# Patient Record
Sex: Female | Born: 1980
Health system: Southern US, Community
[De-identification: ages and names within clinical notes are randomized; demographics above are authoritative.]

## PROBLEM LIST (undated history)

## (undated) DIAGNOSIS — E2839 Other primary ovarian failure: Secondary | ICD-10-CM

## (undated) DIAGNOSIS — R87629 Unspecified abnormal cytological findings in specimens from vagina: Secondary | ICD-10-CM

## (undated) DIAGNOSIS — R000 Tachycardia, unspecified: Secondary | ICD-10-CM

## (undated) DIAGNOSIS — E288 Other ovarian dysfunction: Secondary | ICD-10-CM

## (undated) HISTORY — DX: Tachycardia, unspecified: R00.0

## (undated) HISTORY — DX: Other primary ovarian failure: E28.39

## (undated) HISTORY — DX: Other ovarian dysfunction: E28.8

## (undated) HISTORY — DX: Unspecified abnormal cytological findings in specimens from vagina: R87.629

## (undated) HISTORY — PX: GYNECOLOGIC CRYOSURGERY: SHX857

---

## 2013-05-23 ENCOUNTER — Ambulatory Visit (INDEPENDENT_AMBULATORY_CARE_PROVIDER_SITE_OTHER): Payer: BC Managed Care – PPO | Admitting: Cardiology

## 2013-05-23 ENCOUNTER — Encounter: Payer: Self-pay | Admitting: Cardiology

## 2013-05-23 ENCOUNTER — Encounter (INDEPENDENT_AMBULATORY_CARE_PROVIDER_SITE_OTHER): Payer: Self-pay

## 2013-05-23 VITALS — BP 100/68 | HR 53 | Ht 66.0 in | Wt 121.0 lb

## 2013-05-23 DIAGNOSIS — R002 Palpitations: Secondary | ICD-10-CM

## 2013-05-23 DIAGNOSIS — R000 Tachycardia, unspecified: Secondary | ICD-10-CM

## 2013-05-23 MED ORDER — METOPROLOL SUCCINATE ER 25 MG PO TB24: 25.0000 mg | ORAL_TABLET | Freq: Every day | ORAL | Status: DC

## 2013-05-23 NOTE — Progress Notes (Signed)
[address] [place], [place]  [address] Phone: [phone] Fax:  [phone]  Date:  05/23/2013   ID:  Ellen Summers, DOB [birth year], MRN [id number]  PCP:  No primary provider on file.   History of Present Illness: Ellen Summers is a 32 y.o. female hx of chest pain with ETT without ischemia but noted resting heart rate 104 during test that increased to 206 during exercise. BP 114/78. She was given Toprol 25 mg once a day and has done very well with this medication. She was previously having several palpitations and her heart rate would increase to 170 with just walking and she wanted to start the beta blocker. She feels great on the medication. Her heart rate is better controlled now up to 170 to 180 at peak exercise as compared to > 200 and resting heart rate down 20 beats. She feels so much better and denies any side effects on the Toprol.    Patient denies chest pain, dizziness, syncope, swelling, SOB, nor PND.    Wt Readings from Last 3 Encounters:  05/23/13 121 lb (54.885 kg)     Past Medical History  Diagnosis Date  . Tachycardia     No past surgical history on file.  Current Outpatient Prescriptions  Medication Sig Dispense Refill  . levonorgestrel (MIRENA) 20 MCG/24HR IUD 1 each by Intrauterine route once.      . metoprolol succinate (TOPROL-XL) 25 MG 24 hr tablet Take 25 mg by mouth daily.      . Multiple Vitamin (MULTIVITAMIN) tablet Take 1 tablet by mouth daily.      . Omega-3 Fatty Acids (FISH OIL) 1000 MG CAPS Take 2,000 mg by mouth 2 (two) times daily.       No current facility-administered medications for this visit.    Allergies:   No Known Allergies  Social History:  The patient  reports that she has never smoked. She does not have any smokeless tobacco history on file. She reports that she drinks alcohol. She reports that she does not use illicit drugs.   Family history: Emelia Loron, paternal had MI at age 23.  ROS:   Please see the history of present illness.   No syncope, no bleeding, no orthopnea, no PND   PHYSICAL EXAM: VS:  BP 100/68  Pulse 53  Ht 5\' 6"  (1.676 m)  Wt 121 lb (54.885 kg)  BMI 19.54 kg/m2 Well nourished, well developed, in no acute distress HEENT: normal Neck: no JVD Cardiac:  normal S1, S2; RRR; no murmur Lungs:  clear to auscultation bilaterally, no wheezing, rhonchi or rales Abd: soft, nontender, no hepatomegaly Ext: no edema Skin: warm and dry Neuro: no focal abnormalities noted  EKG:  Sinus bradycardia rate 53, borderline LVH voltage criteria likely normal variant.  ECHO: 07/07/11: 1. Normal LV size and function.  2. Left ventricular ejection fraction estimated by 2D at 60-65 percent.  3. There were no regional wall motion abnormalities.  4. Trace mitral valve regurgitation.  5. Trivial tricuspid regurgitation.  6. Trace of pulmonic valve regurgitation.  ETT: 07/07/11:  Preexercise EKG demonstrated sinus tachycardia rate 104, short PR interval, no delta wave, nonspecific ST changes. LVH pattern noted, likely due to body habitus. During exercise, heart rate increased to 153 beats per minute at the end of stage I or 3 minutes, then at its peak was 206 beats per minute, sinus, narrow complex with no ST segment changes. Heart rate at 2 minutes was  153 once again. No sustained arrhythmias were noted. Blood pressure increased from 102/78 at rest, 160/76 at stress. She completed 11 minutes of exercise. She did state that at peak exercise she had mild chest tightness. This resolved at rest. No ST segment changes. Impression: Low risk exercise treadmill test. Good overall exercise tolerance. Elevated heart rate during maximal stress but appears to have normal conduction/sinus tachycardia.   ASSESSMENT AND PLAN:  1. Palpitations - tachycardia. doing very well on low-dose Toprol. EKG reassuring. No changes made. Continue with exercise.  Signed, Donato Schultz, MD [facility]  05/23/2013  3:52 PM

## 2013-05-23 NOTE — Patient Instructions (Signed)
Your physician recommends that you continue on your current medications as directed. Please refer to the Current Medication list given to you today.  Your physician wants you to follow-up in: 1 year with Dr. Skains. You will receive a reminder letter in the mail two months in advance. If you don't receive a letter, please call our office to schedule the follow-up appointment.  

## 2014-05-29 ENCOUNTER — Other Ambulatory Visit: Payer: Self-pay | Admitting: Cardiology

## 2014-06-18 ENCOUNTER — Encounter: Payer: Self-pay | Admitting: Physician Assistant

## 2014-06-18 ENCOUNTER — Ambulatory Visit (INDEPENDENT_AMBULATORY_CARE_PROVIDER_SITE_OTHER): Payer: PRIVATE HEALTH INSURANCE | Admitting: Physician Assistant

## 2014-06-18 VITALS — BP 102/70 | HR 59 | Ht 66.0 in | Wt 119.0 lb

## 2014-06-18 DIAGNOSIS — R000 Tachycardia, unspecified: Secondary | ICD-10-CM

## 2014-06-18 DIAGNOSIS — R002 Palpitations: Secondary | ICD-10-CM

## 2014-06-18 DIAGNOSIS — I517 Cardiomegaly: Secondary | ICD-10-CM

## 2014-06-18 MED ORDER — METOPROLOL SUCCINATE ER 25 MG PO TB24: 25.0000 mg | ORAL_TABLET | Freq: Every day | ORAL | Status: DC

## 2014-06-18 NOTE — Progress Notes (Signed)
WUJ:WJXBJYNW Ellen Summers is a 34 y.o. female patient of Dr. Anne Fu with hx of chest pain with ETT without ischemia but noted resting heart rate 104 during test that increased to 206 during exercise. BP 114/78. She was given Toprol 25 mg once a day and has done very well with this medication. She was previously having several palpitations and her heart rate would increase to 170 with just walking and she wanted to start the beta blocker. She feels great on the medication. Her heart rate is better controlled now up to 170 to 180 at peak exercise as compared to > 200 and resting heart rate down 20 beats. EKGs in the past have shown LVH but 2-D echo 2 years ago showed normal LV function with no evidence of LVH.  Patient is here today for her yearly follow-up and renewal of her Toprol. She continues to run 3 miles daily. She can get her heart rate up to 180 bpm and feel fine. It no longer gets over 200. She says she may feel little fatigued from the metoprolol but is bearable. She has occasional palpitations when she is stressed out at work, but overall it is controlled.  No Known Allergies   Current Outpatient Prescriptions  Medication Sig Dispense Refill  . levonorgestrel (MIRENA) 20 MCG/24HR IUD 1 each by Intrauterine route once.    . metoprolol succinate (TOPROL-XL) 25 MG 24 hr tablet TAKE ONE TABLET BY MOUTH ONE TIME DAILY  30 tablet 0  . Multiple Vitamin (MULTIVITAMIN) tablet Take 1 tablet by mouth daily.    . Omega-3 Fatty Acids (FISH OIL) 1000 MG CAPS Take 2,000 mg by mouth 2 (two) times daily.     No current facility-administered medications for this visit.    Past Medical History  Diagnosis Date  . Tachycardia     No past surgical history on file.  Family History  Problem Relation Age of Onset  . Heart attack Paternal Grandfather   . Coronary artery disease Neg Hx     History   Social History  . Marital Status: Married    Spouse Name: N/A    Number of  Children: N/A  . Years of Education: N/A   Occupational History  . Not on file.   Social History Main Topics  . Smoking status: Never Smoker   . Smokeless tobacco: Not on file  . Alcohol Use: Yes     Comment: 1-2 drinks per week  . Drug Use: No  . Sexual Activity: Not on file   Other Topics Concern  . Not on file   Social History Narrative    ROS:see history of present illness otherwise negative   BP 102/70 mmHg  Pulse 59  Ht  (1.676 m)  Wt 119 lb (53.978 kg)  BMI 19.22 kg/m2  PHYSICAL EXAM: Well-nournished, in no acute distress. Neck: No JVD, HJR, Bruit, or thyroid enlargement  Lungs: No tachypnea, clear without wheezing, rales, or rhonchi  Cardiovascular: RRR, PMI not displaced, Normal S1 and S2, no murmurs, gallops, bruit, thrill, or heave.  Abdomen: BS normal. Soft without organomegaly, masses, lesions or tenderness.  Extremities: without cyanosis, clubbing or edema. Good distal pulses bilateral  SKin: Warm, no lesions or rashes   Musculoskeletal: No deformities  Neuro: no focal signs   Wt Readings from Last 3 Encounters:  06/18/14 119 lb (53.978 kg)  05/23/13 121 lb (54.885 kg)    No results found for: WBC, HGB, HCT, PLT, GLUCOSE, CHOL,  TRIG, HDL, LDLDIRECT, LDLCALC, ALT, AST, NA, K, CL, CREATININE, BUN, CO2, TSH, PSA, INR, GLUF, HGBA1C, MICROALBUR  LKG:MWNUUEKG:Sinus bradycardia at 59 bpm with LVH. Similar to EKG 2 years ago.

## 2014-06-18 NOTE — Assessment & Plan Note (Signed)
Tachycardia seems to be controlled with Toprol. We'll renew prescription. She does have LVH on EKG which is unchanged. Recommend 2-D echo to follow-up and make sure she does not have any underlying LVH. It's been 2 years since her last echo.

## 2014-06-18 NOTE — Assessment & Plan Note (Signed)
Palpitations controlled.

## 2014-06-18 NOTE — Patient Instructions (Signed)
Your physician recommends that you continue on your current medications as directed. Please refer to the Current Medication list given to you today. Your physician has requested that you have an echocardiogram. Echocardiography is a painless test that uses sound waves to create images of your heart. It provides your doctor with information about the size and shape of your heart and how well your heart's chambers and valves are working. This procedure takes approximately one hour. There are no restrictions for this procedure.  Your physician wants you to follow-up in: 1 YEAR WITH DR Anne FuSKAINS.   You will receive a reminder letter in the mail two months in advance. If you don't receive a letter, please call our office to schedule the follow-up appointment.

## 2014-06-25 ENCOUNTER — Ambulatory Visit (HOSPITAL_COMMUNITY): Payer: PRIVATE HEALTH INSURANCE | Attending: Cardiology

## 2014-06-25 DIAGNOSIS — I517 Cardiomegaly: Secondary | ICD-10-CM | POA: Diagnosis not present

## 2014-06-25 DIAGNOSIS — R000 Tachycardia, unspecified: Secondary | ICD-10-CM | POA: Diagnosis not present

## 2014-06-25 DIAGNOSIS — I081 Rheumatic disorders of both mitral and tricuspid valves: Secondary | ICD-10-CM | POA: Insufficient documentation

## 2014-06-25 NOTE — Progress Notes (Signed)
2D Echo completed. 06/25/2014 

## 2014-07-01 ENCOUNTER — Telehealth: Payer: Self-pay | Admitting: Cardiology

## 2014-07-01 NOTE — Telephone Encounter (Signed)
Reviewed results of echo with pt.  Unable to locate the in basket this results is in in order to document on it.

## 2014-07-01 NOTE — Telephone Encounter (Signed)
New Msg ° ° ° ° ° ° ° °Pt calling to get results of echo. ° ° ° °Please return call.  °

## 2015-06-15 NOTE — L&D Delivery Note (Signed)
Delivery Note At 11:32 PM a viable female was delivered via Vaginal, Spontaneous Delivery (Presentation: Right Occiput Anterior  ;  ).  APGAR: 9, 9; weight 8 lb 5 oz (3770 g).  Delivery was complicated by retained placenta. The placenta was manually extracted. Pt tolerated this well however developed postpartum hemorrhage after manual extraction of the placenta. Fundal massage was performed. She received Methergine 0.2 mg IM/ 1000 mcg of cytotec per rectum. .. 1 Amp of hemabate IM. A bedside ultrasound was performed and preliminary report did not show any retained products. Blood was noted within the endometrium The placenta was examined and appeared complete a 3 Vessel cord was noted.  ,  Cord pH: NA  Anesthesia:   Episiotomy: None Lacerations: 3rd degree Suture Repair: 3.0 monocryl and 3-0 vicryl  Est. Blood Loss (mL): 1500  Mom to postpartum.  Baby to Couplet care / Skin to Skin.  Marcellous Snarski J. 04/10/2016, 1:06 AM

## 2015-07-02 LAB — OB RESULTS CONSOLE ABO/RH: RH TYPE: NEGATIVE

## 2015-07-02 LAB — OB RESULTS CONSOLE HEPATITIS B SURFACE ANTIGEN: HEP B S AG: NEGATIVE

## 2015-07-02 LAB — OB RESULTS CONSOLE GC/CHLAMYDIA
CHLAMYDIA, DNA PROBE: NEGATIVE
GC PROBE AMP, GENITAL: NEGATIVE

## 2015-07-02 LAB — OB RESULTS CONSOLE HIV ANTIBODY (ROUTINE TESTING): HIV: NONREACTIVE

## 2015-07-02 LAB — OB RESULTS CONSOLE RPR: RPR: NONREACTIVE

## 2015-08-04 ENCOUNTER — Telehealth: Payer: Self-pay | Admitting: Cardiology

## 2015-08-04 NOTE — Telephone Encounter (Signed)
Will review with Dr Anne Fu and call her back with recommendations

## 2015-08-04 NOTE — Telephone Encounter (Signed)
First of all congratulations. Secondly, let us try without metoprolol. I would recommend stopping it during the pregnancy. If palpitiations begin to worsen which hopefully they will not, labetalol is the preferred beta blocker in pregnancy. We could always try this in the future if absolutely necessary.  Donato Schultz, MD

## 2015-08-04 NOTE — Telephone Encounter (Signed)
New Message:  Pt called in stating that she is [redacted] wks pregnant and DRr. Skains prescribed Metoprolol  for her. She wanted to know there any precautions she needed to take or if her medication needed to be switched. Please f/u with her

## 2015-08-04 NOTE — Telephone Encounter (Signed)
Discussed use of metoprolol 25 mg. Called her personally to discuss this matter with her. I consult the 2 separate pharmacist here and we reviewed data. There have been no reportable fetal abnormalities with metoprolol. It does cross the placenta. At this low dose, concentrations would be exceedingly low. She has stopped the metoprolol. She felt some mild dizziness when standing which could be related to her pregnancy. I encouraged continued hydration. Reassurance. She was thankful for the phone call. Donato Schultz, MD

## 2015-08-04 NOTE — Telephone Encounter (Signed)
Reviewed recommendations with patient who states understanding.  She will stop the metoprolol and call back if she feels she needs the RX for Labetalol.  She is asking about possible complications from taking Metoprolol during the 1st 6 weeks of her pregnancy.  Advised I am unaware of specific complications but will discuss with Dr Anne Fu or one of our pharmacists.   Reviewed with Dr Anne Fu who discussed with pharmacist.  No studies have been done on humans but there have been some incidence of decreased fetal heart rate because it does cross the placenta.  Dr Anne Fu will call and discuss pt's concerns with her.

## 2015-08-11 DIAGNOSIS — O200 Threatened abortion: Secondary | ICD-10-CM | POA: Insufficient documentation

## 2015-08-30 ENCOUNTER — Other Ambulatory Visit: Payer: Self-pay | Admitting: Physician Assistant

## 2015-12-23 LAB — OB RESULTS CONSOLE RPR: RPR: NONREACTIVE

## 2016-01-13 LAB — OB RESULTS CONSOLE ABO/RH: RH TYPE: NEGATIVE

## 2016-02-27 LAB — OB RESULTS CONSOLE GBS: STREP GROUP B AG: POSITIVE

## 2016-03-31 ENCOUNTER — Telehealth (HOSPITAL_COMMUNITY): Payer: Self-pay | Admitting: *Deleted

## 2016-03-31 ENCOUNTER — Encounter (HOSPITAL_COMMUNITY): Payer: Self-pay | Admitting: *Deleted

## 2016-03-31 NOTE — Telephone Encounter (Signed)
Preadmission screen  

## 2016-04-08 ENCOUNTER — Telehealth (HOSPITAL_COMMUNITY): Payer: Self-pay | Admitting: *Deleted

## 2016-04-08 ENCOUNTER — Other Ambulatory Visit: Payer: Self-pay | Admitting: Obstetrics and Gynecology

## 2016-04-08 NOTE — Telephone Encounter (Signed)
Preadmission screen  

## 2016-04-08 NOTE — H&P (Signed)
Corliss SkainsJennifer Raudenbush is a 35 y.o. female G1P0 at 41 wks and 0 days presenting for induction of labor due to post dates. Pregnancy has been uncomplicated. She receives prenatal care at [facility] with Dr. [name] as her primary provider. Her pregnancy was conceived via IVF with donor egg. She has a  H/o premature ovarian failure.   . OB History    Gravida Para Term Preterm AB Living   1             SAB TAB Ectopic Multiple Live Births                 Past Medical History:  Diagnosis Date  . Premature ovarian failure   . Tachycardia   . Vaginal Pap smear, abnormal    Past Surgical History:  Procedure Laterality Date  . GYNECOLOGIC CRYOSURGERY     Family History: family history includes Diabetes in her maternal grandfather, maternal grandmother, and mother; Heart attack in her paternal grandfather; Heart disease in her maternal grandfather, maternal grandmother, and paternal grandfather; Hypertension in her mother; Thyroid disease in her maternal grandmother; Varicose Veins in her mother. Social History:  reports that she has never smoked. She has never used smokeless tobacco. She reports that she drinks alcohol. She reports that she does not use drugs.     Maternal Diabetes: No Genetic Screening: Normal Maternal Ultrasounds/Referrals: Normal Fetal Ultrasounds or other Referrals:  None Maternal Substance Abuse:  No Significant Maternal Medications:  None Significant Maternal Lab Results:  Lab values include: Group B Strep positive. RH negative  Other Comments:  None  Review of Systems  Constitutional: Negative.   HENT: Negative.   Eyes: Negative.   Respiratory: Negative.   Cardiovascular: Negative.   Gastrointestinal: Negative.   Genitourinary: Negative.   Musculoskeletal: Negative.   Skin: Negative.   Neurological: Negative.   Endo/Heme/Allergies: Negative.   Psychiatric/Behavioral: Negative.    Maternal Medical History:  Fetal activity: Perceived fetal activity is normal.     Prenatal complications: no prenatal complications Prenatal Complications - Diabetes: none.      Last menstrual period 06/15/2015. Maternal Exam:  Abdomen: Patient reports no abdominal tenderness. Fetal presentation: vertex  Introitus: Normal vulva. Normal vagina.  Pelvis: adequate for delivery.      Physical Exam  Vitals reviewed. Constitutional: She is oriented to person, place, and time. She appears well-developed and well-nourished.  HENT:  Head: Normocephalic and atraumatic.  Eyes: Conjunctivae are normal. Pupils are equal, round, and reactive to light.  Cardiovascular: Normal rate and regular rhythm.   Respiratory: Effort normal and breath sounds normal.  GI: There is no tenderness.  Genitourinary: Vagina normal.  Musculoskeletal: Normal range of motion. She exhibits no edema.  Neurological: She is alert and oriented to person, place, and time.  Skin: Skin is warm and dry.  Psychiatric: She has a normal mood and affect.    Prenatal labs: ABO, Rh: O/Negative/-- (08/01 0000) Antibody:  Negative Rubella:   RPR: Nonreactive (07/11 0000)  HBsAg: Negative (01/18 0000)  HIV: Non-reactive (01/18 0000)  GBS: Positive (09/15 0000)   Assessment/Plan: 41 wks and 0 days for induction due to post dates.  Plan cytotec for cervical ripening.  Penicillin for GBS prophylaxis.  Anticipate SVD Dr. Dion BodyVarnado covering midnight until 7am 04/09/2016.  I will assume care at 7 am on 04/09/2016   Tylar Merendino J. 04/08/2016, 2:38 PM

## 2016-04-09 ENCOUNTER — Inpatient Hospital Stay (HOSPITAL_COMMUNITY): Payer: Managed Care, Other (non HMO) | Admitting: Anesthesiology

## 2016-04-09 ENCOUNTER — Inpatient Hospital Stay (HOSPITAL_COMMUNITY)
Admission: AD | Admit: 2016-04-09 | Discharge: 2016-04-11 | DRG: 767 | Disposition: A | Discharge status: Home / Self Care | Payer: Managed Care, Other (non HMO) | Source: Ambulatory Visit | Attending: Obstetrics and Gynecology | Admitting: Obstetrics and Gynecology

## 2016-04-09 ENCOUNTER — Encounter (HOSPITAL_COMMUNITY): Payer: Self-pay

## 2016-04-09 ENCOUNTER — Inpatient Hospital Stay (HOSPITAL_COMMUNITY)
Admission: RE | Admit: 2016-04-09 | Discharge: 2016-04-09 | Disposition: A | Discharge status: Home / Self Care | Payer: Managed Care, Other (non HMO) | Source: Ambulatory Visit | Attending: Obstetrics and Gynecology | Admitting: Obstetrics and Gynecology

## 2016-04-09 DIAGNOSIS — Z3A41 41 weeks gestation of pregnancy: Secondary | ICD-10-CM | POA: Diagnosis not present

## 2016-04-09 DIAGNOSIS — O9081 Anemia of the puerperium: Secondary | ICD-10-CM | POA: Diagnosis present

## 2016-04-09 DIAGNOSIS — D649 Anemia, unspecified: Secondary | ICD-10-CM | POA: Diagnosis present

## 2016-04-09 DIAGNOSIS — O480 Post-term pregnancy: Secondary | ICD-10-CM | POA: Diagnosis present

## 2016-04-09 DIAGNOSIS — R58 Hemorrhage, not elsewhere classified: Secondary | ICD-10-CM

## 2016-04-09 LAB — CBC
HEMATOCRIT: 31.7 % — AB (ref 36.0–46.0)
HEMOGLOBIN: 11.1 g/dL — AB (ref 12.0–15.0)
MCH: 32.4 pg (ref 26.0–34.0)
MCHC: 35 g/dL (ref 30.0–36.0)
MCV: 92.4 fL (ref 78.0–100.0)
Platelets: 172 10*3/uL (ref 150–400)
RBC: 3.43 MIL/uL — ABNORMAL LOW (ref 3.87–5.11)
RDW: 14.6 % (ref 11.5–15.5)
WBC: 8.5 10*3/uL (ref 4.0–10.5)

## 2016-04-09 LAB — RPR: RPR Ser Ql: NONREACTIVE

## 2016-04-09 MED ORDER — LACTATED RINGERS IV SOLN
500.0000 mL | INTRAVENOUS | Status: DC | PRN
  Administered 2016-04-10: 1000 mL via INTRAVENOUS

## 2016-04-09 MED ORDER — SOD CITRATE-CITRIC ACID 500-334 MG/5ML PO SOLN: 30.0000 mL | ORAL | Status: DC | PRN

## 2016-04-09 MED ORDER — DIPHENHYDRAMINE HCL 50 MG/ML IJ SOLN: 12.5000 mg | INTRAMUSCULAR | Status: DC | PRN

## 2016-04-09 MED ORDER — PENICILLIN G POTASSIUM 5000000 UNITS IJ SOLR
5.0000 10*6.[IU] | Freq: Once | INTRAMUSCULAR | Status: AC
  Administered 2016-04-09: 5 10*6.[IU] via INTRAVENOUS
  Filled 2016-04-09: qty 5

## 2016-04-09 MED ORDER — LACTATED RINGERS IV SOLN: 500.0000 mL | Freq: Once | INTRAVENOUS | Status: DC

## 2016-04-09 MED ORDER — OXYCODONE-ACETAMINOPHEN 5-325 MG PO TABS: 2.0000 | ORAL_TABLET | ORAL | Status: DC | PRN

## 2016-04-09 MED ORDER — PHENYLEPHRINE 40 MCG/ML (10ML) SYRINGE FOR IV PUSH (FOR BLOOD PRESSURE SUPPORT): 80.0000 ug | PREFILLED_SYRINGE | INTRAVENOUS | Status: DC | PRN

## 2016-04-09 MED ORDER — OXYCODONE-ACETAMINOPHEN 5-325 MG PO TABS: 1.0000 | ORAL_TABLET | ORAL | Status: DC | PRN

## 2016-04-09 MED ORDER — LIDOCAINE HCL (PF) 1 % IJ SOLN
INTRAMUSCULAR | Status: DC | PRN
  Administered 2016-04-09 (×2): 6 mL

## 2016-04-09 MED ORDER — TERBUTALINE SULFATE 1 MG/ML IJ SOLN: 0.2500 mg | Freq: Once | INTRAMUSCULAR | Status: DC | PRN

## 2016-04-09 MED ORDER — MISOPROSTOL 25 MCG QUARTER TABLET
25.0000 ug | ORAL_TABLET | ORAL | Status: DC | PRN
  Administered 2016-04-09 (×2): 25 ug via VAGINAL
  Filled 2016-04-09 (×2): qty 0.25

## 2016-04-09 MED ORDER — EPHEDRINE 5 MG/ML INJ: 10.0000 mg | INTRAVENOUS | Status: DC | PRN

## 2016-04-09 MED ORDER — LACTATED RINGERS IV SOLN
500.0000 mL | Freq: Once | INTRAVENOUS | Status: AC
  Administered 2016-04-09: 500 mL via INTRAVENOUS

## 2016-04-09 MED ORDER — ACETAMINOPHEN 325 MG PO TABS: 650.0000 mg | ORAL_TABLET | ORAL | Status: DC | PRN

## 2016-04-09 MED ORDER — HYDROXYZINE HCL 50 MG PO TABS: 50.0000 mg | ORAL_TABLET | Freq: Four times a day (QID) | ORAL | Status: DC | PRN

## 2016-04-09 MED ORDER — FENTANYL 2.5 MCG/ML BUPIVACAINE 1/10 % EPIDURAL INFUSION (WH - ANES)
14.0000 mL/h | INTRAMUSCULAR | Status: DC | PRN
  Administered 2016-04-09 (×2): 14 mL/h via EPIDURAL
  Filled 2016-04-09 (×2): qty 125

## 2016-04-09 MED ORDER — ONDANSETRON HCL 4 MG/2ML IJ SOLN
4.0000 mg | Freq: Four times a day (QID) | INTRAMUSCULAR | Status: DC | PRN
  Administered 2016-04-10: 4 mg via INTRAVENOUS
  Filled 2016-04-09: qty 2

## 2016-04-09 MED ORDER — PENICILLIN G POTASSIUM 5000000 UNITS IJ SOLR
2.5000 10*6.[IU] | INTRAVENOUS | Status: DC
  Administered 2016-04-09 (×2): 2.5 10*6.[IU] via INTRAVENOUS
  Filled 2016-04-09 (×9): qty 2.5

## 2016-04-09 MED ORDER — OXYTOCIN 40 UNITS IN LACTATED RINGERS INFUSION - SIMPLE MED: 2.5000 [IU]/h | INTRAVENOUS | Status: DC

## 2016-04-09 MED ORDER — OXYTOCIN 40 UNITS IN LACTATED RINGERS INFUSION - SIMPLE MED
1.0000 m[IU]/min | INTRAVENOUS | Status: DC
  Administered 2016-04-09: 2 m[IU]/min via INTRAVENOUS
  Filled 2016-04-09: qty 1000

## 2016-04-09 MED ORDER — LIDOCAINE HCL (PF) 1 % IJ SOLN
30.0000 mL | INTRAMUSCULAR | Status: DC | PRN
  Administered 2016-04-09: 30 mL via SUBCUTANEOUS
  Filled 2016-04-09: qty 30

## 2016-04-09 MED ORDER — LACTATED RINGERS IV SOLN
INTRAVENOUS | Status: DC
  Administered 2016-04-09 (×4): via INTRAVENOUS

## 2016-04-09 MED ORDER — FENTANYL CITRATE (PF) 100 MCG/2ML IJ SOLN: 50.0000 ug | INTRAMUSCULAR | Status: DC | PRN

## 2016-04-09 MED ORDER — OXYTOCIN BOLUS FROM INFUSION
500.0000 mL | Freq: Once | INTRAVENOUS | Status: AC
  Administered 2016-04-09: 500 mL via INTRAVENOUS

## 2016-04-09 MED ORDER — PHENYLEPHRINE 40 MCG/ML (10ML) SYRINGE FOR IV PUSH (FOR BLOOD PRESSURE SUPPORT)
80.0000 ug | PREFILLED_SYRINGE | INTRAVENOUS | Status: DC | PRN
  Filled 2016-04-09: qty 10

## 2016-04-09 NOTE — Progress Notes (Signed)
Ellen Summers is a 35 y.o. G1P0 at 545w0d  admitted for induction of labor due to Post dates. Due date 04/02/2016.  Subjective: She is comfortable with her epidural. Reports mild headache.   Objective: BP 109/62   Pulse 73   Temp 98.2 F (36.8 C) (Oral)   Resp 15   Ht 5\' 6"  (1.676 m)   Wt 64.9 kg (143 lb)   LMP 06/15/2015   SpO2 97%   BMI 23.08 kg/m  No intake/output data recorded. No intake/output data recorded.  FHT:  FHR: 130 bpm, variability: moderate,  accelerations:  Present,  decelerations:  Absent UC:   regular, every 2-3 minutes SVE:   Dilation: 6 Effacement (%): 90 Station: 0 Exam by:: Ellen Doppole, MD  Labs: Lab Results  Component Value Date   WBC 8.5 04/09/2016   HGB 11.1 (L) 04/09/2016   HCT 31.7 (L) 04/09/2016   MCV 92.4 04/09/2016   PLT 172 04/09/2016    Assessment / Plan: induction of labor due to post dates   Labor: AROM performed continue pitocin  Preeclampsia:  NA Fetal Wellbeing:  Category I Pain Control:  Epidural I/D:  Penicillin Anticipated MOD:  NSVD  Ellen Summers J. 04/09/2016, 5:42 PM

## 2016-04-09 NOTE — Anesthesia Procedure Notes (Signed)
Epidural Patient location during procedure: OB  Staffing Anesthesiologist: Sherrian DiversENENNY, Jaelynn Pozo  Preanesthetic Checklist Completed: patient identified, site marked, surgical consent, pre-op evaluation, timeout performed, IV checked, risks and benefits discussed and monitors and equipment checked  Epidural Patient position: sitting Prep: DuraPrep Patient monitoring: blood pressure and heart rate Approach: midline Location: L3-L4 Injection technique: LOR air  Needle:  Needle type: Tuohy  Needle gauge: 17 G Needle length: 9 cm Needle insertion depth: 4 cm Catheter type: closed end flexible Catheter size: 19 Gauge Catheter at skin depth: 12 cm Test dose: negative and Other  Assessment Events: blood not aspirated, injection not painful, no injection resistance, negative IV test and no paresthesia  Additional Notes Reason for block:procedure for pain

## 2016-04-09 NOTE — Anesthesia Preprocedure Evaluation (Signed)
Anesthesia Evaluation  Patient identified by MRN, date of birth, ID band Patient awake    Reviewed: Allergy & Precautions, NPO status , Patient's Chart, lab work & pertinent test results  Airway Mallampati: II  TM Distance: >3 FB Neck ROM: Full    Dental no notable dental hx.    Pulmonary neg pulmonary ROS,    Pulmonary exam normal breath sounds clear to auscultation       Cardiovascular negative cardio ROS Normal cardiovascular exam Rhythm:Regular Rate:Normal     Neuro/Psych negative neurological ROS  negative psych ROS   GI/Hepatic negative GI ROS, Neg liver ROS,   Endo/Other  negative endocrine ROS  Renal/GU negative Renal ROS  negative genitourinary   Musculoskeletal negative musculoskeletal ROS (+)   Abdominal   Peds negative pediatric ROS (+)  Hematology negative hematology ROS (+)   Anesthesia Other Findings   Reproductive/Obstetrics negative OB ROS                             Anesthesia Physical Anesthesia Plan  ASA: II  Anesthesia Plan: Epidural   Post-op Pain Management:    Induction: Intravenous  Airway Management Planned: Natural Airway  Additional Equipment:   Intra-op Plan:   Post-operative Plan:   Informed Consent: I have reviewed the patients History and Physical, chart, labs and discussed the procedure including the risks, benefits and alternatives for the proposed anesthesia with the patient or authorized representative who has indicated his/her understanding and acceptance.     Plan Discussed with: CRNA  Anesthesia Plan Comments: (Informed consent obtained prior to proceeding including risk of failure, 1% risk of PDPH, risk of minor discomfort and bruising.  Discussed rare but serious complications including epidural abscess, permanent nerve injury, epidural hematoma.  Discussed alternatives to epidural analgesia and patient desires to proceed.  Timeout  performed pre-procedure verifying patient name, procedure, and platelet count.  Patient tolerated procedure well.)        Anesthesia Quick Evaluation  

## 2016-04-09 NOTE — Progress Notes (Signed)
Ellen Summers is a 35 y.o. G1P0 at 6377w0d  admitted for induction of labor due to Post dates. Due date 04/02/2016.  Subjective: Contractions rated as 3-7 out of 10. No lof no vaginal bleeding. Pt desires an epidural.   Objective: BP 126/67   Pulse 68   Temp 98 F (36.7 C) (Oral)   Resp 17   Ht 5\' 6"  (1.676 m)   Wt 64.9 kg (143 lb)   LMP 06/15/2015   BMI 23.08 kg/m  No intake/output data recorded. No intake/output data recorded.  FHT:  FHR: 130 bpm, variability: moderate,  accelerations:  Present,  decelerations:  Present variable at times  UC:   regular, every 2-3 minutes SVE:   Dilation: 4 Effacement (%): 80 Station: -1 Exam by:: [name], MD  Labs: Lab Results  Component Value Date   WBC 8.5 04/09/2016   HGB 11.1 (L) 04/09/2016   HCT 31.7 (L) 04/09/2016   MCV 92.4 04/09/2016   PLT 172 04/09/2016    Assessment / Plan: Induction of labor due to postdates   Labor: start pitocin  Preeclampsia:  NA Fetal Wellbeing:  Category I Pain Control:  planning epidural  I/D:  penicillin for gbs prophylaxis  Anticipated MOD:  NSVD  Jarome Trull J. 04/09/2016, 1:28 PM

## 2016-04-09 NOTE — Anesthesia Pain Management Evaluation Note (Signed)
  CRNA Pain Management Visit Note  Patient: Ellen Summers, 35 y.o., female  "Hello I am a member of the anesthesia team at [facility]. We have an anesthesia team available at all times to provide care throughout the hospital, including epidural management and anesthesia for C-section. I don't know your plan for the delivery whether it a natural birth, water birth, IV sedation, nitrous supplementation, doula or epidural, but we want to meet your pain goals."   1.Was your pain managed to your expectations on prior hospitalizations?   No prior hospitalizations  2.What is your expectation for pain management during this hospitalization?     Epidural  3.How can we help you reach that goal? Epidural   Record the patient's initial score and the patient's pain goal.   Pain: 0  Pain Goal: 10 [facility] wants you to be able to say your pain was always managed very well.  Rica RecordsICKELTON,Ellen Summers 04/09/2016

## 2016-04-10 ENCOUNTER — Encounter (HOSPITAL_COMMUNITY): Payer: Self-pay | Admitting: *Deleted

## 2016-04-10 ENCOUNTER — Inpatient Hospital Stay (HOSPITAL_COMMUNITY): Payer: Managed Care, Other (non HMO)

## 2016-04-10 LAB — DIC (DISSEMINATED INTRAVASCULAR COAGULATION)PANEL
Fibrinogen: 375 mg/dL (ref 210–475)
INR: 1.06
Platelets: 162 10*3/uL (ref 150–400)
Smear Review: NONE SEEN
aPTT: 26 seconds (ref 24–36)

## 2016-04-10 LAB — CBC
HCT: 29.9 % — ABNORMAL LOW (ref 36.0–46.0)
HEMATOCRIT: 25.4 % — AB (ref 36.0–46.0)
HEMOGLOBIN: 10.4 g/dL — AB (ref 12.0–15.0)
Hemoglobin: 9.1 g/dL — ABNORMAL LOW (ref 12.0–15.0)
MCH: 32.5 pg (ref 26.0–34.0)
MCH: 33.5 pg (ref 26.0–34.0)
MCHC: 34.8 g/dL (ref 30.0–36.0)
MCHC: 35.8 g/dL (ref 30.0–36.0)
MCV: 93.4 fL (ref 78.0–100.0)
MCV: 93.4 fL (ref 78.0–100.0)
Platelets: 171 10*3/uL (ref 150–400)
Platelets: 138 10*3/uL — ABNORMAL LOW (ref 150–400)
RBC: 2.72 MIL/uL — AB (ref 3.87–5.11)
RBC: 3.2 MIL/uL — AB (ref 3.87–5.11)
RDW: 14.4 % (ref 11.5–15.5)
RDW: 14.5 % (ref 11.5–15.5)
WBC: 15 10*3/uL — ABNORMAL HIGH (ref 4.0–10.5)
WBC: 19.7 10*3/uL — AB (ref 4.0–10.5)

## 2016-04-10 LAB — DIC (DISSEMINATED INTRAVASCULAR COAGULATION) PANEL
D DIMER QUANT: 3.87 ug{FEU}/mL — AB (ref 0.00–0.50)
PROTHROMBIN TIME: 13.8 s (ref 11.4–15.2)

## 2016-04-10 LAB — PREPARE RBC (CROSSMATCH)

## 2016-04-10 LAB — POSTPARTUM HEMORRHAGE PROTOCOL (BB NOTIFICATION)

## 2016-04-10 MED ORDER — DIBUCAINE 1 % RE OINT: 1.0000 "application " | TOPICAL_OINTMENT | RECTAL | Status: DC | PRN

## 2016-04-10 MED ORDER — SENNOSIDES-DOCUSATE SODIUM 8.6-50 MG PO TABS: 2.0000 | ORAL_TABLET | ORAL | Status: DC

## 2016-04-10 MED ORDER — MISOPROSTOL 200 MCG PO TABS
ORAL_TABLET | ORAL | Status: AC
  Filled 2016-04-10: qty 5

## 2016-04-10 MED ORDER — ACETAMINOPHEN 325 MG PO TABS
650.0000 mg | ORAL_TABLET | ORAL | Status: DC | PRN
  Administered 2016-04-10 – 2016-04-11 (×3): 650 mg via ORAL
  Filled 2016-04-10 (×3): qty 2

## 2016-04-10 MED ORDER — COCONUT OIL OIL
1.0000 "application " | TOPICAL_OIL | Status: DC | PRN
  Administered 2016-04-11: 1 via TOPICAL
  Filled 2016-04-10: qty 120

## 2016-04-10 MED ORDER — ONDANSETRON HCL 4 MG PO TABS: 4.0000 mg | ORAL_TABLET | ORAL | Status: DC | PRN

## 2016-04-10 MED ORDER — METHYLERGONOVINE MALEATE 0.2 MG PO TABS
0.2000 mg | ORAL_TABLET | ORAL | Status: AC
  Administered 2016-04-10 – 2016-04-11 (×6): 0.2 mg via ORAL
  Filled 2016-04-10 (×6): qty 1

## 2016-04-10 MED ORDER — MISOPROSTOL 200 MCG PO TABS
1000.0000 ug | ORAL_TABLET | Freq: Once | ORAL | Status: AC
  Administered 2016-04-10: 1000 ug via RECTAL

## 2016-04-10 MED ORDER — FERROUS SULFATE 325 (65 FE) MG PO TABS: 325.0000 mg | ORAL_TABLET | Freq: Every day | ORAL | Status: DC

## 2016-04-10 MED ORDER — MISOPROSTOL 200 MCG PO TABS
ORAL_TABLET | ORAL | Status: AC
  Filled 2016-04-10: qty 1

## 2016-04-10 MED ORDER — SODIUM CHLORIDE 0.9 % IV SOLN: 250.0000 mL | INTRAVENOUS | Status: DC | PRN

## 2016-04-10 MED ORDER — SIMETHICONE 80 MG PO CHEW: 80.0000 mg | CHEWABLE_TABLET | ORAL | Status: DC | PRN

## 2016-04-10 MED ORDER — ONDANSETRON HCL 4 MG/2ML IJ SOLN: 4.0000 mg | INTRAMUSCULAR | Status: DC | PRN

## 2016-04-10 MED ORDER — LACTATED RINGERS IV SOLN
INTRAVENOUS | Status: DC
  Administered 2016-04-10: 04:00:00 via INTRAVENOUS

## 2016-04-10 MED ORDER — DIPHENHYDRAMINE HCL 50 MG/ML IJ SOLN: 25.0000 mg | Freq: Once | INTRAMUSCULAR | Status: DC

## 2016-04-10 MED ORDER — OXYCODONE HCL 5 MG PO TABS: 5.0000 mg | ORAL_TABLET | ORAL | Status: DC | PRN

## 2016-04-10 MED ORDER — SODIUM CHLORIDE 0.9% FLUSH: 3.0000 mL | INTRAVENOUS | Status: DC | PRN

## 2016-04-10 MED ORDER — FERROUS SULFATE 325 (65 FE) MG PO TABS
325.0000 mg | ORAL_TABLET | Freq: Two times a day (BID) | ORAL | Status: DC
  Administered 2016-04-10 – 2016-04-11 (×3): 325 mg via ORAL
  Filled 2016-04-10 (×4): qty 1

## 2016-04-10 MED ORDER — METHYLERGONOVINE MALEATE 0.2 MG/ML IJ SOLN
0.2000 mg | Freq: Once | INTRAMUSCULAR | Status: AC
  Administered 2016-04-10: 0.2 mg via INTRAMUSCULAR

## 2016-04-10 MED ORDER — BENZOCAINE-MENTHOL 20-0.5 % EX AERO
1.0000 "application " | INHALATION_SPRAY | CUTANEOUS | Status: DC | PRN
  Administered 2016-04-10: 1 via TOPICAL
  Filled 2016-04-10: qty 56

## 2016-04-10 MED ORDER — METHYLERGONOVINE MALEATE 0.2 MG/ML IJ SOLN: 0.2000 mg | INTRAMUSCULAR | Status: AC

## 2016-04-10 MED ORDER — DIPHENOXYLATE-ATROPINE 2.5-0.025 MG PO TABS
2.0000 | ORAL_TABLET | Freq: Four times a day (QID) | ORAL | Status: DC | PRN
  Administered 2016-04-10: 2 via ORAL
  Filled 2016-04-10: qty 2

## 2016-04-10 MED ORDER — SODIUM CHLORIDE 0.9 % IV SOLN
3.0000 g | Freq: Four times a day (QID) | INTRAVENOUS | Status: AC
  Administered 2016-04-10 (×4): 3 g via INTRAVENOUS
  Filled 2016-04-10 (×4): qty 3

## 2016-04-10 MED ORDER — RHO D IMMUNE GLOBULIN 1500 UNIT/2ML IJ SOSY
300.0000 ug | PREFILLED_SYRINGE | Freq: Once | INTRAMUSCULAR | Status: AC
  Administered 2016-04-10: 300 ug via INTRAVENOUS
  Filled 2016-04-10: qty 2

## 2016-04-10 MED ORDER — OXYCODONE HCL 5 MG PO TABS: 10.0000 mg | ORAL_TABLET | ORAL | Status: DC | PRN

## 2016-04-10 MED ORDER — DIPHENHYDRAMINE HCL 25 MG PO CAPS: 25.0000 mg | ORAL_CAPSULE | Freq: Four times a day (QID) | ORAL | Status: DC | PRN

## 2016-04-10 MED ORDER — IBUPROFEN 600 MG PO TABS
600.0000 mg | ORAL_TABLET | Freq: Four times a day (QID) | ORAL | Status: DC
  Administered 2016-04-10 – 2016-04-11 (×6): 600 mg via ORAL
  Filled 2016-04-10 (×7): qty 1

## 2016-04-10 MED ORDER — SODIUM CHLORIDE 0.9 % IV SOLN: Freq: Once | INTRAVENOUS | Status: DC

## 2016-04-10 MED ORDER — PRENATAL MULTIVITAMIN CH
1.0000 | ORAL_TABLET | Freq: Every day | ORAL | Status: DC
  Administered 2016-04-10 – 2016-04-11 (×2): 1 via ORAL
  Filled 2016-04-10 (×2): qty 1

## 2016-04-10 MED ORDER — ZOLPIDEM TARTRATE 5 MG PO TABS: 5.0000 mg | ORAL_TABLET | Freq: Every evening | ORAL | Status: DC | PRN

## 2016-04-10 MED ORDER — SODIUM CHLORIDE 0.9% FLUSH
3.0000 mL | Freq: Two times a day (BID) | INTRAVENOUS | Status: DC
  Administered 2016-04-10: 3 mL via INTRAVENOUS

## 2016-04-10 MED ORDER — CARBOPROST TROMETHAMINE 250 MCG/ML IM SOLN
250.0000 ug | Freq: Once | INTRAMUSCULAR | Status: AC
  Administered 2016-04-10: 250 ug via INTRAMUSCULAR

## 2016-04-10 MED ORDER — WITCH HAZEL-GLYCERIN EX PADS: 1.0000 "application " | MEDICATED_PAD | CUTANEOUS | Status: DC | PRN

## 2016-04-10 MED ORDER — METHYLERGONOVINE MALEATE 0.2 MG/ML IJ SOLN
INTRAMUSCULAR | Status: AC
  Administered 2016-04-10: 0.2 mg via INTRAMUSCULAR
  Filled 2016-04-10: qty 1

## 2016-04-10 NOTE — Lactation Note (Signed)
This note was copied from a baby's chart. Lactation Consultation Note initial visit at 20 hours of age.  Mom had IVF due to premature ovarian failure.  Mom had 1500 EBL at delivery.  Mom reports short feedings and baby has been sleepy.  Mom is eager to attempt feeding at this time.  Mom has small firm breast with short everted nipples, tissue is only semi compressible.  MOm has scab appearance to right nipple that she reports has been there during pregnancy.  LC expressed colostrum around nipple and noted it to be dried colostrum.  When removed milk was easily expressed.    Assisted mom with football hold, baby latched with wide gape and flanged lips only briefly.  Baby continues to lose hold of the breast with lower jaw in various positions including "laid back."   LC fit mom with #20 Nipple shield with good fit.  Mom is able to reapply NS properly.  Baby latched well with few sucks and then became tired.   LC assisted mom with hand expression and spoon feeding 1ml to baby.  Baby was not active with licking of spoon and more sleepy.    Mom shown how to use DEBP & how to disassemble, clean, & reassemble parts. Mom encouraged to post pump 8x daily.   Plan is for mom to latch baby, post pump and hand express.  Mom to offer EBM as supplement by spoon as needed.   [facility] given and discussed.  Encouraged to feed with early cues on demand.  Early newborn behavior discussed.  Hand expression demonstrated by mom with colostrum visible.  Mom to call for assist as needed.      Patient Name: Ellen Corliss SkainsJennifer Mcfayden [id number]'s Date: 04/10/2016 Reason for consult: Initial assessment   Maternal Data Has patient been taught Hand Expression?: Yes Does the patient have breastfeeding experience prior to this delivery?: No  Feeding Feeding Type: Breast Fed Length of feed:  (few minutes)  LATCH Score/Interventions Latch: Repeated attempts needed to sustain latch, nipple held in mouth throughout feeding,  stimulation needed to elicit sucking reflex. Intervention(s): Adjust position;Assist with latch;Breast massage;Breast compression  Audible Swallowing: A few with stimulation Intervention(s): Skin to skin;Hand expression;Alternate breast massage  Type of Nipple: Everted at rest and after stimulation  Comfort (Breast/Nipple): Soft / non-tender     Hold (Positioning): Assistance needed to correctly position infant at breast and maintain latch. Intervention(s): Breastfeeding basics reviewed;Support Pillows;Position options;Skin to skin  LATCH Score: 7  Lactation Tools Discussed/Used Tools: Nipple Shields Nipple shield size: 20 Pump Review: Setup, frequency, and cleaning;Milk Storage Initiated by:: js Date initiated:: 04/11/16   Consult Status Consult Status: Follow-up Date: 04/11/16 Follow-up type: In-patient    Jannifer RodneyShoptaw, Jana Lynn 04/10/2016, 8:09 PM

## 2016-04-10 NOTE — Progress Notes (Signed)
Post Partum Day 1 s/p SVD with manual removal of retained placenta and postpartum hemorrhage  Subjective: tolerating PO and + flatus pt reports feeling sore all over  .Marland Kitchen. Per nurse bleeding has been light.   Objective: Blood pressure 112/63, pulse 86, temperature 98.2 F (36.8 C), temperature source Oral, resp. rate 20, height 5\' 6"  (1.676 m), weight 64.9 kg (143 lb), last menstrual period 06/15/2015, SpO2 100 %, unknown if currently breastfeeding.  Physical Exam:  General: alert and cooperative Lochia: appropriate Uterine Fundus: firm Incision: NA DVT Evaluation: No evidence of DVT seen on physical exam.   Recent Labs  04/10/16 0025 04/10/16 0525  HGB 10.4* 9.1*  HCT 29.9* 25.4*    Assessment/Plan: Stable s/p SVD with manual removal of placenta and postpartum hemorrhage- appropriate lochia  Anemia - currently asymptomatic  Feso4 Manual extraction of placena- continue unasyn for 24 hours.  Uterine Atony- resolved .Marland Kitchen. Continue methergine 0.2 mg every 4 hours  x 6 doses (total)  D/C Foley  Saline lock IV  Remove epidural catheter Encourage Ambulation  Routine postpartum care      LOS: 1 day   Sahir Tolson J. 04/10/2016, 10:09 AM

## 2016-04-10 NOTE — Progress Notes (Signed)
Code hemorrhage called @ 0015 following manual removal of retained placenta.

## 2016-04-10 NOTE — Progress Notes (Signed)
Ultrasound at bedside at this time.

## 2016-04-11 DIAGNOSIS — D649 Anemia, unspecified: Secondary | ICD-10-CM | POA: Diagnosis present

## 2016-04-11 LAB — RH IG WORKUP (INCLUDES ABO/RH)
ABO/RH(D): O NEG
FETAL SCREEN: NEGATIVE
Gestational Age(Wks): 41
UNIT DIVISION: 0

## 2016-04-11 MED ORDER — IBUPROFEN 600 MG PO TABS: 600.0000 mg | ORAL_TABLET | Freq: Four times a day (QID) | ORAL | 1 refills | Status: DC | PRN

## 2016-04-11 NOTE — Discharge Instructions (Signed)
Postpartum Hemorrhage °Postpartum hemorrhage is excessive blood loss after childbirth. Some blood loss is normal after delivering a baby. However, postpartum hemorrhage is a potentially serious condition.  °CAUSES  °· A loss of muscle tone in the uterus after childbirth. °· Failure to deliver all of the placenta. °· Wounds in the birth canal caused by delivery of the fetus. °· A maternal bleeding disorder that prevents blood clotting (rare). °RISK FACTORS °You are at greater risk for postpartum hemorrhage if you: °· Have a history of postpartum hemorrhage. °· Have delivered more than one baby. °· Had preeclampsia or eclampsia. °· Had problems with the placenta. °· Had complications during your labor or delivery. °· Are obese. °· Are Asian or Hispanic. °SIGNS AND SYMPTOMS  °Vaginal bleeding after delivery is normal and should be expected. Bleeding (lochia) will occur for several days after childbirth. This can be expected with normal vaginal deliveries and cesarean deliveries.  °You are bleeding too much after your delivery if you are: °· Passing large clots or pieces of tissue. This may be small pieces of placenta left after delivery.   °· Soaking more than one sanitary pad per hour for several hours.   °· Having heavy, bright-red bleeding that occurs 4 days or more after delivery.   °· Having a discharge that has a bad smell or if you begin to run an unexplained fever.   °· Having times of lightheadedness or fainting, feeling short of breath, or having your heart beat fast with very little activity.   °DIAGNOSIS  °A diagnosis is based on your symptoms and a physical exam of your perineum, vagina, cervix, and uterus. Diagnostic tests may include: °· Blood pressure and pulse. °· Blood tests. °· Blood clotting tests. °· Ultrasonography. °TREATMENT °· Treatment is based on the severity of bleeding and may include: °¨ Uterine massage. °¨ Medicines. °¨ Blood transfusions. °· Sometimes bleeding occurs if portions of the  placenta are left behind in the uterus after delivery. If this happens, often a curettage or scraping of the inside of the uterus must be done. This usually stops the bleeding. If this treatment does not stop the bleeding, surgery (hysterectomy) may have to be performed to remove the uterus. °· If bleeding is due to clotting or bleeding problems that are not related to the pregnancy, other treatments may be needed.   °HOME CARE INSTRUCTIONS  °· Limit your activity as directed by your health care provider. Your health care provider may order bed rest (getting up to the bathroom only) or may allow you to continue light activity.   °· Keep track of the number of pads you use each day and how soaked (saturated) they are. Write this number down.   °· Do not use tampons. Do not douche or have sexual intercourse until approved by your health care provider.   °· Drink enough fluids to keep your urine clear or pale yellow.   °· Get proper amounts of rest.   °· Eat foods that are rich in iron, such as spinach, red meat, and legumes.   °SEEK IMMEDIATE MEDICAL CARE IF: °· You experience severe cramps in your stomach, back, or belly (abdomen).   °· You have a fever.   °· You pass large clots or tissue. Save any tissue for your health care provider to look at.   °· Your bleeding increases. °· You become weak or lightheaded, or you pass out.   °· Your sanitary pad count per hour is increasing. °MAKE SURE YOU: °· Understand these instructions. °· Will watch your condition. °· Will get help right away if   you are not doing well or get worse. °  °This information is not intended to replace advice given to you by your health care provider. Make sure you discuss any questions you have with your health care provider. °  °Document Released: 08/21/2003 Document Revised: 06/05/2013 Document Reviewed: 11/16/2012 °Elsevier Interactive Patient Education ©2016 [employer] Inc[employer] ° °

## 2016-04-11 NOTE — Discharge Summary (Signed)
OB Discharge Summary     Patient Name: Ellen SkainsJennifer Summers DOB: May 20, 1981 MRN: [id number]  Date of admission: 04/09/2016 Delivering MD: Gerald LeitzOLE, Finnbar Cedillos   Date of discharge: 04/11/2016  Admitting diagnosis: 32 WEEKS 48 HOURS PLUS OF BAD ABDOMINAL PAIN Intrauterine pregnancy: 732w0d     Secondary diagnosis:  Active Problems:   Post-dates pregnancy   Postpartum hemorrhage   Anemia  Additional problems: None     Discharge diagnosis: Term Pregnancy Delivered                                                                                                Post partum procedures:none  Augmentation: Pitocin and Cytotec  Complications: Hemorrhage>102200mL  Hospital course:  Induction of Labor With Vaginal Delivery   35 y.o. yo G1P1001 at 2932w0d was admitted to the hospital 04/09/2016 for induction of labor.  Indication for induction: Postdates.  Patient had an uncomplicated labor course as follows: Membrane Rupture Time/Date: 5:31 PM ,04/09/2016   Intrapartum Procedures: Episiotomy: None [1]                                         Lacerations:  3rd degree [4]   Manual extractions of the placenta  Patient had delivery of a Viable infant.  Information for the patient's newborn:  Deri FuellingFordahl, Girl Scarlettrose [[id number]][[id number]]  Delivery Method: Vaginal, Spontaneous Delivery (Filed from Delivery Summary)   04/09/2016  Details of delivery can be found in separate delivery note.  Patient is discharged home 04/11/16.   Physical exam Vitals:   04/10/16 0430 04/10/16 0812 04/10/16 1631 04/11/16 0527  BP: (!) 103/57 112/63 115/62 (!) 105/55  Pulse: 80 86 87 95  Resp: 18 20 20 18   Temp: 98.3 F (36.8 C) 98.2 F (36.8 C) 97.8 F (36.6 C) 98.6 F (37 C)  TempSrc: Oral Oral Oral Oral  SpO2: 100%  99%   Weight:      Height:       General: alert, cooperative and no distress Lochia: appropriate Uterine Fundus: firm Incision: N/A DVT Evaluation: No evidence of DVT seen on physical exam. Labs: Lab Results   Component Value Date   WBC 19.7 (H) 04/10/2016   HGB 9.1 (L) 04/10/2016   HCT 25.4 (L) 04/10/2016   MCV 93.4 04/10/2016   PLT 138 (L) 04/10/2016   No flowsheet data found.  Discharge instruction: per After Visit Summary and "Baby and Me Booklet".  After visit meds:    Medication List    TAKE these medications   ferrous sulfate 325 (65 FE) MG tablet Take 325 mg by mouth daily with breakfast.   ibuprofen 600 MG tablet Commonly known as:  ADVIL,MOTRIN Take 1 tablet (600 mg total) by mouth every 6 (six) hours as needed.   omega-3 acid ethyl esters 1 g capsule Commonly known as:  LOVAZA Take 1 g by mouth daily.   PRENATAL GUMMIES/DHA & FA 0.4-32.5 MG Chew Chew 2 each by mouth daily.       Diet: routine diet  Activity: Advance as tolerated. Pelvic rest for 6 weeks.   Outpatient follow up:2 weeks Follow up Appt:No future appointments. Follow up Visit:No Follow-up on file.  Postpartum contraception: None  Newborn Data: Live born female  Birth Weight: 8 lb 5 oz (3770 g) APGAR: 9, 9  Baby Feeding: Bottle and Breast Disposition:home with mother   04/11/2016 Jessee AversOLE,Velva Molinari J., MD

## 2016-04-11 NOTE — Lactation Note (Signed)
This note was copied from a baby's chart. Lactation Consultation Note  Patient Name: Ellen Summers [id number]'s Date: 04/11/2016 Reason for consult: Follow-up assessment;Breast/nipple pain;Difficult latch   Mom has history of infertily, IVF with donor egg due to ovarian failure, 1500 EBL at delivery- all risk factors for either low milk supply, and/or delayed milk volume onset   Baby 37 hrs old.  Started supplementation during the night using Alimentum and curved tip syringe.  Mom was pumping, but unable to express anything to give to baby.  Baby's latch has been shallow, even with the nipple shield.  Nipples are reddened, and some bruising noted, more so on right side.  Offered assisted with latch.  Mom feels her breasts are softening more since yesterday.  Hand expression yielding small dot of colostrum on left side only.  Positioned baby in football hold.  Assisted Mom in using firm breast compression.  Baby can open widely, but unable to attain a deep areolar grasp of breast, so she latches to base of nipple shield.  Demonstrated chin tug to evert lower lip, but baby slips right back to base of nipple.  Baby has a moderate labial frenulum, and upper lip tends to tuck in as well.  No colostrum noted in shield.  Baby popping on and off and fussy. Mom not complaining of pain, so initiated a 5 fr feeding tube under the nipple shield.  FOB instilled the supplement slowly while baby in suck bursts.  Talked about use slow flow bottle using the pace method of feeding.  Volume parameters given, with today being 12-15 ml at each feeding.  Tomorrow increasing to 20-30 ml.    Plan- -STS, Breastfeed >8 times per 24 hrs.  Use nipple shield to assist with latching deeply -Offer supplement per volume parameters provided (5 fr feeding tube at breast, on finger, or slow flow bottle) (Use EBM+/formula) -pump both breasts >8 times per 24 hrs for 15-20 mins  Mom has a DEBP at home (Spectra 2).  Talked about  benefits of renting a [employer] hospital grade pump due to her risk factors.  Information about our program shared with Mom and FOB. OP Lactation appointment made for 04/20/16 @ 9am.  Encouraged Mom to call our office if/when her milk volume increases to see if we have had a cancellation and can see her sooner.    Mom to call for assistance as needed. To request coconut oil for nipples.    Consult Status Consult Status: Follow-up Date: 04/20/16 Follow-up type: Out-patient    Ellen Summers, Ellen Summers 04/11/2016, 12:47 PM

## 2016-04-12 ENCOUNTER — Telehealth (HOSPITAL_COMMUNITY): Payer: Self-pay | Admitting: Lactation Services

## 2016-04-12 NOTE — Telephone Encounter (Signed)
Lactation Telephone Call:  Ellen SkainsJennifer Summers call asking to change appt from next Tuesday 11/7 9 am to this week .  LC office called her back in response to her request to say there was a 9am tomorrow LC O/P. By the time Ellen Summers called back it had been filled by a Dr.'s request .  This LC explained the situation to mom and recommended she call back in the am to see if there were any cancellations. If not for tomorrow to call back daily to see if there are any cancellations. [facility] office would try there best to grant her  Request. LC spoke to dad due to mom pumping and LC could hear mom responding in the back ground.

## 2016-04-12 NOTE — Anesthesia Postprocedure Evaluation (Signed)
Anesthesia Post Note  Patient: Ellen Summers  Procedure(s) Performed: * No procedures listed *  Patient location during evaluation: Mother Baby Anesthesia Type: Epidural Level of consciousness: awake and alert Pain management: pain level controlled Vital Signs Assessment: post-procedure vital signs reviewed and stable Respiratory status: spontaneous breathing, nonlabored ventilation and respiratory function stable Cardiovascular status: stable Postop Assessment: no headache, no backache and epidural receding Anesthetic complications: no Comments: Patient discharged 04-11-16 with no noted complications. Chart reviewed.    Last Vitals: There were no vitals filed for this visit.  Last Pain: There were no vitals filed for this visit.               Raeley Gilmore J

## 2016-04-13 LAB — TYPE AND SCREEN
ABO/RH(D): O NEG
Antibody Screen: POSITIVE
DAT, IgG: NEGATIVE
UNIT DIVISION: 0
Unit division: 0

## 2016-04-15 ENCOUNTER — Ambulatory Visit (HOSPITAL_COMMUNITY): Admission: RE | Admit: 2016-04-15 | Payer: Managed Care, Other (non HMO) | Source: Ambulatory Visit

## 2016-04-16 ENCOUNTER — Encounter (HOSPITAL_COMMUNITY): Payer: Managed Care, Other (non HMO)

## 2016-04-16 ENCOUNTER — Ambulatory Visit (HOSPITAL_COMMUNITY)
Admission: RE | Admit: 2016-04-16 | Discharge: 2016-04-16 | Disposition: A | Discharge status: Home / Self Care | Payer: Managed Care, Other (non HMO) | Source: Ambulatory Visit | Attending: Obstetrics and Gynecology | Admitting: Obstetrics and Gynecology

## 2016-04-16 NOTE — Lactation Note (Signed)
Lactation Consult  Mother's reason for visit: Baby not latching and Pt has healing nipples  Visit Type:  OP  Appointment Notes:  Pt is here to day with her baby Ellen Summers.  Ellen Summers has a narrow gape and is not attaching to the breast.  Causing nipple trauma to Ellen Summers. She is currently bottle feeding Ellen Summers. Tummy time, jaw massage and suck evaluation performed.Snapback was seen and heard. Upper and lower lips tuck. Ellen Summers was placed in a laid back BF position and did open wider and attached better. She suckled briefly and initially engaged but did not remain active even with breast compression. She transferred 8 ml.  Mom's breasts are very firm and did not soften easily with feeding or expression.  Therapeutic breast massage of lactation performed and breasts softened somewhat. Suspect as Ellen Summers continues massage milk will flow better. Attempted to Finger feed Ellen Summers.  She did not transfer independently. Parents will continue to work with her. Introduced Special Needs Feeder on the easiest flow. She was not able to go to a more difficult flow at this time.  She was relaxed while eating. Parents will advance to stronger flow as muscles get stronger. Body work may benefit her to help with tight jaw and encourage her to turn her head to the right (less preferred side).  Plan: Laid back BF Special Needs feeder for nutrition and SNS for exercises Oral exercises to advance to the hard and soft palate juncture Tummy time for wider mouth and neck ROM Laid back breast feeding  Follow-up in 1 week   Consult:  Initial Lactation Consultant:  Ellen Summers, Ellen Summers  ________________________________________________________________________ Ellen FloresBaby's Name:  Ellen FunkLillian Wren Summers Date of Birth:  04/09/2014 Pediatrician:  Ellen PeachGAY Gender:  female Gestational Age: 3825w0d (At Birth) Birth Weight:  8 lb 5 oz (3770 g) Weight at Discharge:  Weight: 7 lb 14.1 oz (3575 g)             Date of Discharge:  04/11/2016      Filed Weights   04/09/16 2332 04/11/16 0000  Weight: 8 lb 5 oz (3770 g) 7 lb 14.1 oz (3575 g)  [facility]  [facility]# [facility]    Location:[facility] Weight today:  8#8.5 oz    ________________________________________________________________________  Mother's Name: Ellen SkainsJennifer Summers Type of delivery:  vaginal  Maternal Medical Conditions:  Post-partum hemorrhage Maternal Medications:  NA  ________________________________________________________________________  Breastfeeding History (Post Discharge)   Voids:  6+ in 24 hrs.  Color:  Clear yellow Stools:  6+ in 24 hrs.  Color:  Yellow  ________________________________________________________________________  MJudie Petit

## 2016-04-20 ENCOUNTER — Ambulatory Visit (HOSPITAL_COMMUNITY): Payer: PRIVATE HEALTH INSURANCE

## 2016-04-23 ENCOUNTER — Ambulatory Visit (HOSPITAL_COMMUNITY)
Admission: RE | Admit: 2016-04-23 | Discharge: 2016-04-23 | Disposition: A | Discharge status: Home / Self Care | Payer: Managed Care, Other (non HMO) | Source: Ambulatory Visit | Attending: Obstetrics and Gynecology | Admitting: Obstetrics and Gynecology

## 2016-04-23 NOTE — Lactation Note (Signed)
Lactation Consult  Mother's reason for visit:  Follow-up  Visit Type:  OP  Appointment Notes:    Ellen Summers has been exclusively pumping and Bo Feeding Ellen Summers. Attempts are made to BF about once a day but Ellen Summers is not transferring from the breast.  Ellen Summers also receives formula.  Parents have been doing tongue exercises with Ellen Summers and she has good tongue ROM. She does not flange her upper lip well.  She suckles well on a gloved finger but does not suckle well at the breast.  It is difficult for her to transfer milk even with breast compression.  Ellen Summers is stiff when she is at the breast. Parents also report that she tends to turn her head to one side.  She may benefit from massage which in turn may have a positive impact on BF.  Ellen Summers has difficulty draining her breasts with pumping as well.  She is using a #28 flange (Spectra) and suspect it is too large. A #21 flange will be tried with her hand pump at home and if that drains the breast better she will order the #20 from [employer].    Plan is to continue exercises. Consider body work and try power pumping to increase milk supply. Mom is considering continuing exclusive pumping.  Consult:  Follow-Up Lactation Consultant:  Ellen Summers, Ellen Summers  ________________________________________________________________________ Ellen FloresBaby's Name:  Ellen Summers Date of Birth:  04/09/2014 Pediatrician:  Ellen PeachGay Gender:  female Gestational Age: 141w0d (At Birth) Birth Weight:  8 lb 5 oz (3770 g) Weight at Discharge:  Weight: 7 lb 14.1 oz (3575 g)             Date of Discharge:  04/11/2016     [facility]   04/09/16 2332 04/11/16 0000  Weight: 8 lb 5 oz (3770 g) 7 lb 14.1 oz (3575 g)  [facility]  8#13 [facility]   [facility] Weight today:  9# 1 oz    ________________________________________________________________________  Mother's Name: Ellen Summers Type of delivery:   Vaginal   ________________________________________________________________________  Infant Intake and Output Assessment  Voids:  8 in 24 hrs.  Color:  Clear yellow Stools:  8 in 24 hrs.  Color:  Yellow  ________________________________________________________________________

## 2016-09-09 ENCOUNTER — Telehealth: Payer: Self-pay | Admitting: Cardiology

## 2016-09-09 NOTE — Telephone Encounter (Signed)
Lm to cb.

## 2016-09-09 NOTE — Telephone Encounter (Signed)
New Message     Pt c/o Syncope: STAT if syncope occurred within 30 minutes and pt complains of lightheadedness High Priority if episode of passing out, completely, today or in last 24 hours   1. Did you pass out today? No but feels like going to pass out - has dizzy spells  2. When is the last time you passed out? Has not passed out   3. Has this occurred multiple times? Dizzy spells happens a few times per day along with lowered blood pressure  4. Did you have any symptoms prior to passing out? no

## 2016-09-09 NOTE — Telephone Encounter (Signed)
Spoke with pt who is reporting over the past week she has noticed a lower than normal BP for her.  Her normal is around 115/75 (per pt) and it was 101/60 last night.  She states she felt woozy and sluggish.  Her HR has been in the 70's and regular.  She denies any recent diet changes and reports she has continued to eat foods higher in sodium and drinking plenty of water.  Advised to start out at her PCP office since they will be able to get her in to be seen sooner and if the PCP feels she needs further f/u they can contact us.  Pt is in agreement and will c/b if further questions/needs.

## 2016-09-20 ENCOUNTER — Other Ambulatory Visit (HOSPITAL_COMMUNITY)
Admission: RE | Admit: 2016-09-20 | Discharge: 2016-09-20 | Disposition: A | Discharge status: Home / Self Care | Payer: Managed Care, Other (non HMO) | Source: Ambulatory Visit | Attending: Obstetrics and Gynecology | Admitting: Obstetrics and Gynecology

## 2016-09-20 ENCOUNTER — Other Ambulatory Visit: Payer: Self-pay | Admitting: Obstetrics and Gynecology

## 2016-09-20 DIAGNOSIS — Z01411 Encounter for gynecological examination (general) (routine) with abnormal findings: Secondary | ICD-10-CM | POA: Diagnosis present

## 2016-09-20 DIAGNOSIS — Z1151 Encounter for screening for human papillomavirus (HPV): Secondary | ICD-10-CM | POA: Diagnosis not present

## 2016-09-24 LAB — CYTOLOGY - PAP
Diagnosis: NEGATIVE
HPV (WINDOPATH): NOT DETECTED

## 2017-01-19 NOTE — Progress Notes (Signed)
Cardiology Office Note   Date:  01/20/2017   ID:  Ellen Summers, DOB Mar 31, 1981, MRN [id number]  PCP:  Patient, No Pcp Per  Cardiologist:  Dr. Anne FuSkains    Chief Complaint  Patient presents with  . Tachycardia      History of Present Illness: Ellen SkainsJennifer Summers is a 36 y.o. female who presents for tachycardia- with exertions HR up to 200 easily.    She has a hx of chest pain with ETT without ischemia but noted resting heart rate 104 during test that increased to 206 during exercise. BP 114/78. She was given Toprol 25 mg once a day and had done very well with this medication. She was previously having several palpitations and her heart rate would increase to 170 with just walking and she wanted to start the beta blocker. She felt great on the medication. Her heart rate is better controlled now up to 170 to 180 at peak exercise as compared to > 200 and resting heart rate down 20 beats. EKGs in the past have shown LVH but 2-D echo 2 years ago showed normal LV function with no evidence of LVH.  Today she is here post partum - her baby daughter is 725 months old.  She complains of being very sleepy even after 8 hours sleep.  Sleepy with driving.  She has been diagnosed with premature ovary failure and she needs to be placed on HRT.  She has just started the HRT.  The pregnancy was donated egg.  She is no longer breast feeding and was here about adding back the metoprolol.    Her BP is 98/62 today, she does have periods of dizziness brief episodes.        Past Medical History:  Diagnosis Date  . Premature ovarian failure   . Tachycardia   . Vaginal Pap smear, abnormal     Past Surgical History:  Procedure Laterality Date  . GYNECOLOGIC CRYOSURGERY       Current Outpatient Prescriptions  Medication Sig Dispense Refill  . CALCIUM PO Take 2 tablets by mouth daily.    Colleen Can. JUNEL 1/20 1-20 MG-MCG tablet Take 1 tablet by mouth daily.    . Multiple Vitamin (MULTIVITAMIN) capsule Take 1  capsule by mouth daily.     No current facility-administered medications for this visit.     Allergies:   Patient has no known allergies.    Social History:  The patient  reports that she has never smoked. She has never used smokeless tobacco. She reports that she drinks alcohol. She reports that she does not use drugs.   Family History:  The patient's family history includes Diabetes in her maternal grandfather, maternal grandmother, and mother; Heart attack in her paternal grandfather; Heart disease in her maternal grandfather, maternal grandmother, and paternal grandfather; Hypertension in her mother; Thyroid disease in her maternal grandmother; Varicose Veins in her mother.    ROS:  General:no colds or fevers, no weight changes Skin:no rashes or ulcers HEENT:no blurred vision, no congestion CV:see HPI PUL:see HPI GI:no diarrhea constipation or melena, no indigestion GU:no hematuria, no dysuria MS:no joint pain, no claudication Neuro:no syncope, no lightheadedness Endo:no diabetes, no thyroid disease  Wt Readings from Last 3 Encounters:  01/20/17 115 lb 12.8 oz (52.5 kg)  04/09/16 143 lb (64.9 kg)  06/18/14 119 lb (54 kg)     PHYSICAL EXAM: VS:  BP 98/62   Pulse 73   Ht 5\' 6"  (1.676 m)   Wt 115 lb 12.8  oz (52.5 kg)   SpO2 99%   BMI 18.69 kg/m  , BMI Body mass index is 18.69 kg/m. General:Pleasant affect, NAD Skin:Warm and dry, brisk capillary refill HEENT:normocephalic, sclera clear, mucus membranes moist Neck:supple, no JVD, no bruits  Heart:S1S2 RRR without murmur, gallup, rub or click Lungs:clear without rales, rhonchi, or wheezes NWG:NFAO, non tender, + BS, do not palpate liver spleen or masses Ext:no lower ext edema, 2+ pedal pulses, 2+ radial pulses Neuro:alert and oriented, MAE, follows commands, + facial symmetry    EKG:  EKG is ordered today. The ekg ordered today demonstrates SR minimal criteria for LVH, no changes from 2016.    Recent  Labs: 04/10/2016: Hemoglobin 9.1; Platelets 138    Lipid Panel No results found for: CHOL, TRIG, HDL, CHOLHDL, VLDL, LDLCALC, LDLDIRECT     Other studies Reviewed: Additional studies/ records that were reviewed today include: . Echo 06/25/14  Study Conclusions  - Left ventricle: The cavity size was normal. Wall thickness was normal. Systolic function was normal. The estimated ejection fraction was in the range of 55% to 60%. Wall motion was normal; there were no regional wall motion abnormalities. Left ventricular diastolic function parameters were normal. - Left atrium: The atrium was mildly dilated.  Impressions:  - Normal LV function; mild LAE; trace MR; mild TR.  ASSESSMENT AND PLAN:  1. Tachycardia with minimal activity had been on metoprolol but now post partum and has finished breast feeding would prefer to hold off on BB due to low BP - she is dizzy at times.  She also has increased fatigue -discussed with DOD Dr. Clifton James and will refer to Dr. Graciela Husbands for eval.  In mean time I have asked her not to run with exercise, drink plenty of water, and liberalize salt.  Support stocking would be beneficial.  She agrees to consult.   ? POTS type syndrome    Current medicines are reviewed with the patient today.  The patient Has no concerns regarding medicines.  The following changes have been made:  See above Labs/ tests ordered today include:see above  Disposition:   FU:  see above  Signed, Nada Boozer, NP  01/20/2017 5:24 PM    [facility] [address] [place], [place], [place]  [address]/ [address] [place], [place] Phone: [phone]; Fax: [phone]  [phone]

## 2017-01-20 ENCOUNTER — Encounter (INDEPENDENT_AMBULATORY_CARE_PROVIDER_SITE_OTHER): Payer: Self-pay

## 2017-01-20 ENCOUNTER — Ambulatory Visit (INDEPENDENT_AMBULATORY_CARE_PROVIDER_SITE_OTHER): Payer: BLUE CROSS/BLUE SHIELD | Admitting: Cardiology

## 2017-01-20 ENCOUNTER — Encounter: Payer: Self-pay | Admitting: Cardiology

## 2017-01-20 VITALS — BP 98/62 | HR 73 | Ht 66.0 in | Wt 115.8 lb

## 2017-01-20 DIAGNOSIS — I959 Hypotension, unspecified: Secondary | ICD-10-CM

## 2017-01-20 DIAGNOSIS — R000 Tachycardia, unspecified: Secondary | ICD-10-CM

## 2017-01-20 DIAGNOSIS — R002 Palpitations: Secondary | ICD-10-CM

## 2017-01-20 NOTE — Patient Instructions (Signed)
Medication Instructions:  Your physician recommends that you continue on your current medications as directed. Please refer to the Current Medication list given to you today.   Labwork: None Ordered   Testing/Procedures: None Ordered   Follow-Up: Your physician recommends that you schedule a new patient appointment with Dr. Graciela HusbandsKlein for inappropriate tachycardia and hypertension.   Any Other Special Instructions Will Be Listed Below (If Applicable).     If you need a refill on your cardiac medications before your next appointment, please call your pharmacy.

## 2017-02-07 ENCOUNTER — Encounter (INDEPENDENT_AMBULATORY_CARE_PROVIDER_SITE_OTHER): Payer: Self-pay

## 2017-02-07 ENCOUNTER — Encounter: Payer: Self-pay | Admitting: Cardiology

## 2017-02-07 ENCOUNTER — Ambulatory Visit (INDEPENDENT_AMBULATORY_CARE_PROVIDER_SITE_OTHER): Payer: BLUE CROSS/BLUE SHIELD | Admitting: Cardiology

## 2017-02-07 VITALS — BP 117/68 | HR 83 | Ht 66.0 in | Wt 116.0 lb

## 2017-02-07 DIAGNOSIS — R002 Palpitations: Secondary | ICD-10-CM

## 2017-02-07 MED ORDER — METOPROLOL SUCCINATE ER 25 MG PO TB24: 25.0000 mg | ORAL_TABLET | Freq: Every day | ORAL | 2 refills | Status: DC

## 2017-02-07 NOTE — Addendum Note (Signed)
Addended by: Baird Lyons on: 02/07/2017 10:01 AM   Modules accepted: Orders

## 2017-02-07 NOTE — Patient Instructions (Addendum)
Medication Instructions:  Your physician has recommended you make the following change in your medication: 1. START Toprol 25 mg daily  If you need a refill on your cardiac medications before your next appointment, please call your pharmacy.   Labwork: None ordered  Testing/Procedures: None ordered  Follow-Up: Your physician wants you to follow-up in: 6 months with Dr. Elberta Fortis.  You will receive a reminder letter in the mail two months in advance. If you don't receive a letter, please call our office to schedule the follow-up appointment.  Thank you for choosing [facility]!!   Dory Horn, RN [phone]  Any Other Special Instructions Will Be Listed Below (If Applicable).

## 2017-02-07 NOTE — Progress Notes (Signed)
Electrophysiology Office Note   Date:  02/07/2017   ID:  Ellen Summers, DOB 04-15-1981, MRN [id number]  PCP:  Patient, No Pcp Per  Cardiologist:  Anne Fu Primary Electrophysiologist:  Ellen Weedman Ellen Loa, MD    Chief Complaint  Patient presents with  . Advice Only    Palpitations/Tachycardia     History of Present Illness: Ellen Summers is a 36 y.o. female who is being seen today for the evaluation of tachycardia at the request of Nada Boozer. Presenting today for electrophysiology evaluation. She has a history of chest pain with a negative ETT. Her resting heart rate at that time was 104 and it did increase to 206 during exertion. She was given Toprol 25 mg daily and has done well with this. She was previously having palpitations with a heart rate of 170 while walking. Her heart rate is better controlled at peak exercise up to 170-180.    Today, she denies symptoms of chest pain, shortness of breath, orthopnea, PND, lower extremity edema, claudication, dizziness, presyncope, syncope, bleeding, or neurologic sequela. She is continuing to have episodes of palpitations. Her heart rate gets into the 200s with exercise. She says when she was on the beta blocker, she felt much improved, with quite a bit less symptoms of palpitations. She also gets dizziness when she is up and walking around.   Past Medical History:  Diagnosis Date  . Premature ovarian failure   . Tachycardia   . Vaginal Pap smear, abnormal    Past Surgical History:  Procedure Laterality Date  . GYNECOLOGIC CRYOSURGERY       Current Outpatient Prescriptions  Medication Sig Dispense Refill  . CALCIUM PO Take 2 tablets by mouth daily.    . Multiple Vitamin (MULTIVITAMIN) capsule Take 1 capsule by mouth daily.    . Norgestimate-Ethinyl Estradiol Triphasic (ORTHO TRI-CYCLEN LO) 0.18/0.215/0.25 MG-25 MCG tab Take 1 tablet by mouth daily.    . metoprolol succinate (TOPROL XL) 25 MG 24 hr tablet Take 1 tablet (25  mg total) by mouth daily. 90 tablet 2   No current facility-administered medications for this visit.     Allergies:   Patient has no known allergies.   Social History:  The patient  reports that she has never smoked. She has never used smokeless tobacco. She reports that she drinks alcohol. She reports that she does not use drugs.   Family History:  The patient's family history includes Diabetes in her maternal grandfather, maternal grandmother, and mother; Heart attack in her paternal grandfather; Heart disease in her maternal grandfather, maternal grandmother, and paternal grandfather; Hypertension in her mother; Thyroid disease in her maternal grandmother; Varicose Veins in her mother.    ROS:  Please see the history of present illness.   Otherwise, review of systems is positive for fatigue, dizziness.   All other systems are reviewed and negative.    PHYSICAL EXAM: VS:  BP 117/68 (BP Location: Right Arm, Cuff Size: Normal)   Pulse 83   Ht 5\' 6"  (1.676 m)   Wt 116 lb (52.6 kg)   BMI 18.72 kg/m  , BMI Body mass index is 18.72 kg/m. GEN: Well nourished, well developed, in no acute distress  HEENT: normal  Neck: no JVD, carotid bruits, or masses Cardiac: RRR; no murmurs, rubs, or gallops,no edema  Respiratory:  clear to auscultation bilaterally, normal work of breathing GI: soft, nontender, nondistended, + BS MS: no deformity or atrophy  Skin: warm and dry Neuro:  Strength and sensation are  intact Psych: euthymic mood, full affect  EKG:  EKG is not ordered today. Personal review of the ekg ordered 01/20/17 shows SR, rate 73, LVH by voltage  Orthostatic VS for the past 24 hrs:  BP- Lying Pulse- Lying BP- Sitting Pulse- Sitting BP- Standing at 0 minutes Pulse- Standing at 0 minutes  02/07/17 0924 105/65 77 105/68 77 107/71 89    Recent Labs: 04/10/2016: Hemoglobin 9.1; Platelets 138    Lipid Panel  No results found for: CHOL, TRIG, HDL, CHOLHDL, VLDL, LDLCALC,  LDLDIRECT   Wt Readings from Last 3 Encounters:  02/07/17 116 lb (52.6 kg)  01/20/17 115 lb 12.8 oz (52.5 kg)  04/09/16 143 lb (64.9 kg)      Other studies Reviewed: Additional studies/ records that were reviewed today include: TTE 06/25/16  Review of the above records today demonstrates:  - Left ventricle: The cavity size was normal. Wall thickness was normal. Systolic function was normal. The estimated ejection fraction was in the range of 55% to 60%. Wall motion was normal; there were no regional wall motion abnormalities. Left ventricular diastolic function parameters were normal. - Left atrium: The atrium was mildly dilated.   ASSESSMENT AND PLAN:  1.  Tachycardia: Has been on metoprolol postpartum, but would prefer to hold as she is finished with breast feeding and has had low blood pressures. His dizzy at times with fatigue. He has been instructed to drink plenty of water and liberalize salt. Recommended support stockings. She was previously on Toprol-XL 25 mg and tolerated it well. We'll restart that medication at this time.    Current medicines are reviewed at length with the patient today.   The patient does not have concerns regarding her medicines.  The following changes were made today:  Restart Toprol-XL  Labs/ tests ordered today include:  No orders of the defined types were placed in this encounter.    Disposition:   FU with Jae Skeet 6 months  Signed, Aanika Defoor Ellen Loa, MD  02/07/2017 9:54 AM     [facility] [address] [place] [address] [phone] (office) [phone] (fax)

## 2017-04-06 DIAGNOSIS — J029 Acute pharyngitis, unspecified: Secondary | ICD-10-CM | POA: Diagnosis not present

## 2017-04-21 DIAGNOSIS — Z3169 Encounter for other general counseling and advice on procreation: Secondary | ICD-10-CM | POA: Diagnosis not present

## 2017-04-21 DIAGNOSIS — Z3141 Encounter for fertility testing: Secondary | ICD-10-CM | POA: Diagnosis not present

## 2017-05-11 DIAGNOSIS — R636 Underweight: Secondary | ICD-10-CM | POA: Diagnosis not present

## 2017-05-11 DIAGNOSIS — E2839 Other primary ovarian failure: Secondary | ICD-10-CM | POA: Diagnosis not present

## 2017-05-11 DIAGNOSIS — R197 Diarrhea, unspecified: Secondary | ICD-10-CM | POA: Diagnosis not present

## 2017-05-11 DIAGNOSIS — R109 Unspecified abdominal pain: Secondary | ICD-10-CM | POA: Diagnosis not present

## 2017-07-05 DIAGNOSIS — Z3141 Encounter for fertility testing: Secondary | ICD-10-CM | POA: Diagnosis not present

## 2017-07-12 DIAGNOSIS — Z3141 Encounter for fertility testing: Secondary | ICD-10-CM | POA: Diagnosis not present

## 2017-07-25 DIAGNOSIS — J209 Acute bronchitis, unspecified: Secondary | ICD-10-CM | POA: Diagnosis not present

## 2017-08-03 DIAGNOSIS — Z319 Encounter for procreative management, unspecified: Secondary | ICD-10-CM | POA: Diagnosis not present

## 2017-08-19 DIAGNOSIS — Z3141 Encounter for fertility testing: Secondary | ICD-10-CM | POA: Diagnosis not present

## 2017-08-23 DIAGNOSIS — L739 Follicular disorder, unspecified: Secondary | ICD-10-CM | POA: Diagnosis not present

## 2017-08-23 DIAGNOSIS — L814 Other melanin hyperpigmentation: Secondary | ICD-10-CM | POA: Diagnosis not present

## 2017-08-23 DIAGNOSIS — D2362 Other benign neoplasm of skin of left upper limb, including shoulder: Secondary | ICD-10-CM | POA: Diagnosis not present

## 2017-08-23 DIAGNOSIS — D485 Neoplasm of uncertain behavior of skin: Secondary | ICD-10-CM | POA: Diagnosis not present

## 2017-09-02 DIAGNOSIS — Z3200 Encounter for pregnancy test, result unknown: Secondary | ICD-10-CM | POA: Diagnosis not present

## 2017-09-06 DIAGNOSIS — Z3200 Encounter for pregnancy test, result unknown: Secondary | ICD-10-CM | POA: Diagnosis not present

## 2017-09-12 DIAGNOSIS — Z3200 Encounter for pregnancy test, result unknown: Secondary | ICD-10-CM | POA: Diagnosis not present

## 2017-09-16 DIAGNOSIS — Z3200 Encounter for pregnancy test, result unknown: Secondary | ICD-10-CM | POA: Diagnosis not present

## 2017-09-20 DIAGNOSIS — Z3200 Encounter for pregnancy test, result unknown: Secondary | ICD-10-CM | POA: Diagnosis not present

## 2017-11-03 DIAGNOSIS — Z3200 Encounter for pregnancy test, result unknown: Secondary | ICD-10-CM | POA: Diagnosis not present

## 2017-11-09 DIAGNOSIS — Z3200 Encounter for pregnancy test, result unknown: Secondary | ICD-10-CM | POA: Diagnosis not present

## 2017-11-10 DIAGNOSIS — E2839 Other primary ovarian failure: Secondary | ICD-10-CM | POA: Diagnosis not present

## 2018-01-30 IMAGING — US US PELVIS COMPLETE
1 series · 15 of 25 positions shown · non-contrast
Comparison: None.

ADDENDUM:
The endometrial stripe thickness is 28.4 mm, not 28.4 cm. This was
discussed with Dr. Mussard.
CLINICAL DATA: Postpartum bleeding.  Evaluate for retained placenta

EXAM:
TRANSABDOMINAL ULTRASOUND OF PELVIS
TECHNIQUE: Transabdominal ultrasound examination of the pelvis was performed
including evaluation of the uterus, ovaries, adnexal regions, and
pelvic cul-de-sac.

[Series 1: us pelvis complete · 15 of 34 slices shown]
[im 1/34]
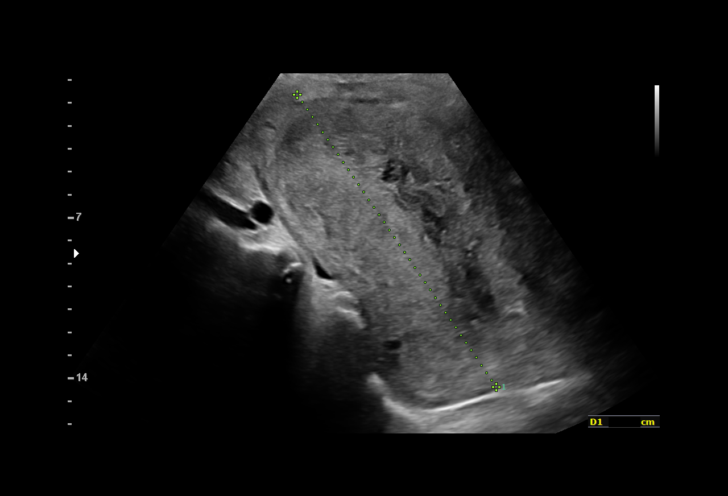
[im 3/34]
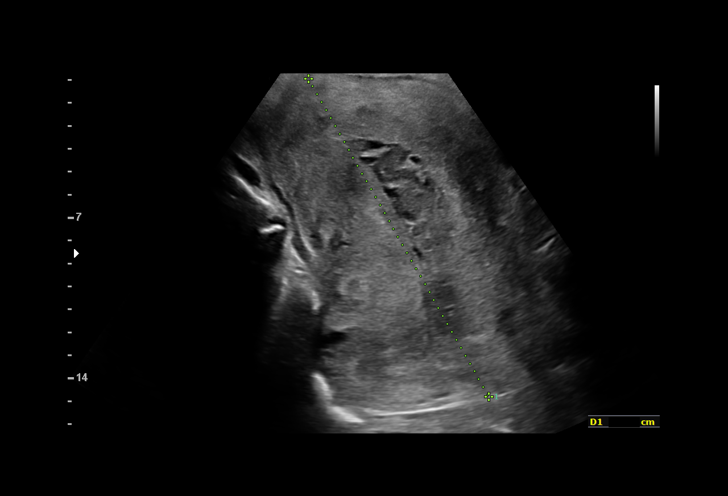
[im 6/34]
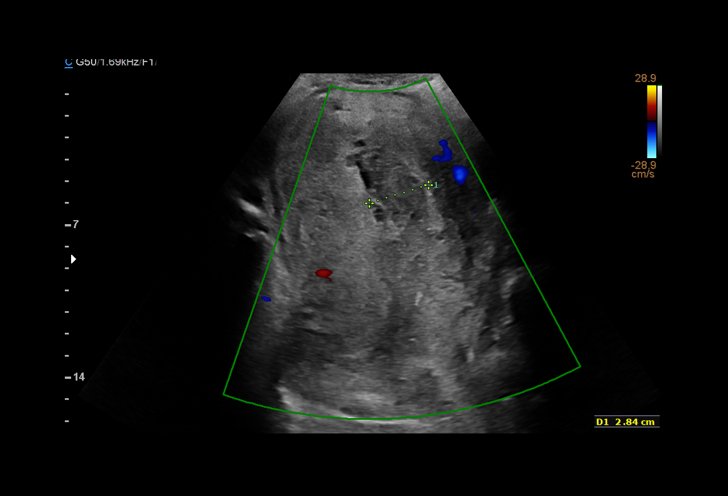
[im 7/34]
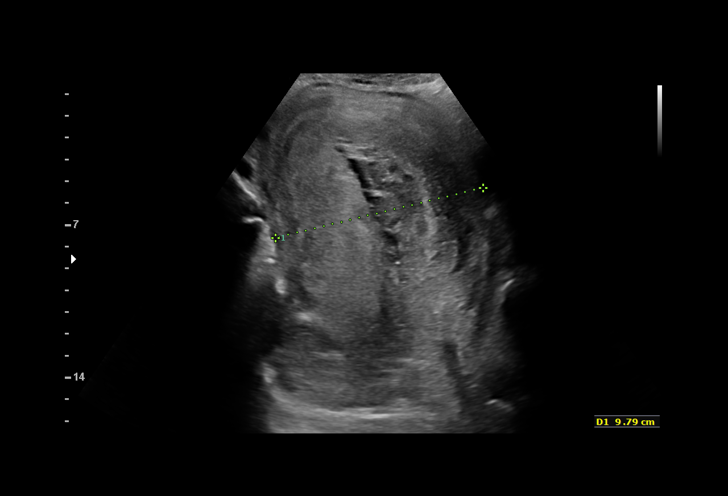
[im 10/34]
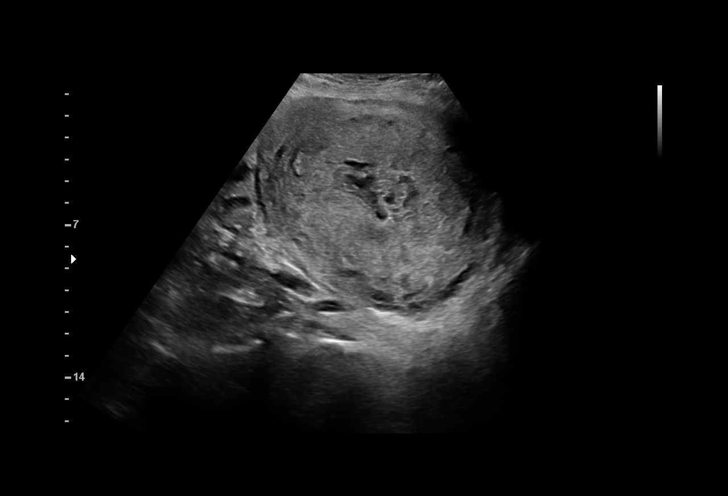
[im 13/34]
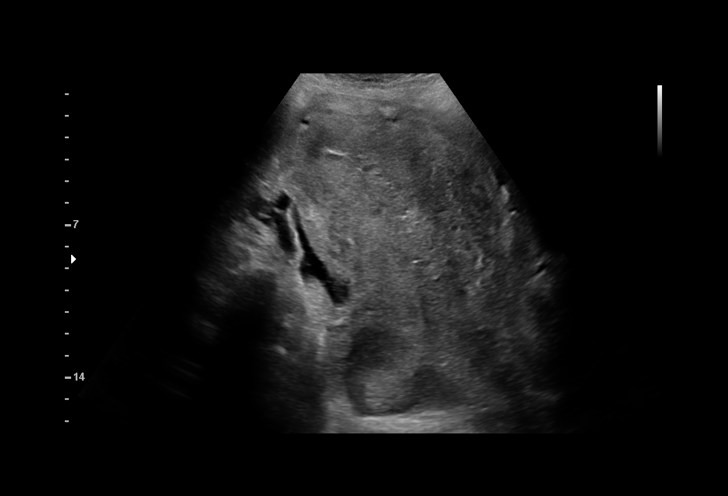
[im 14/34]
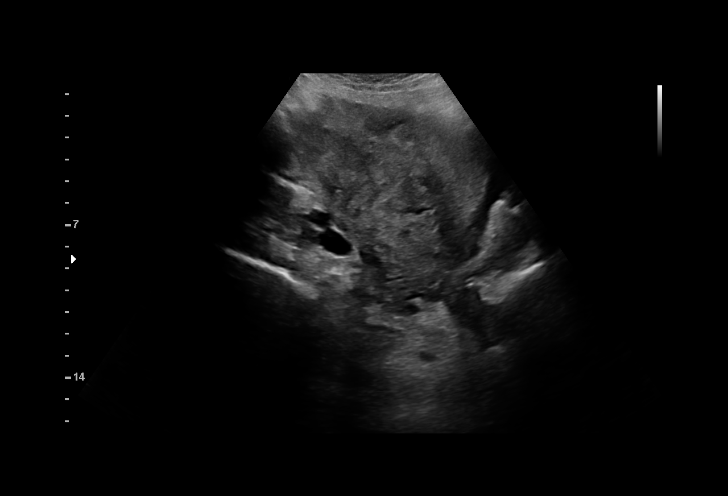
[im 17/34]
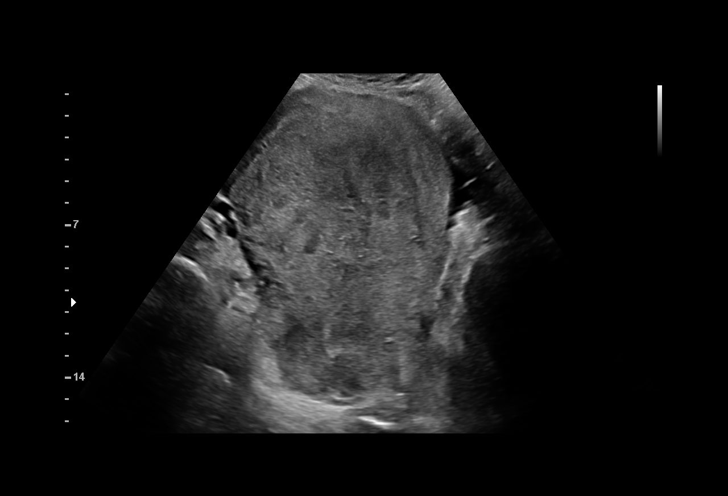
[im 20/34]
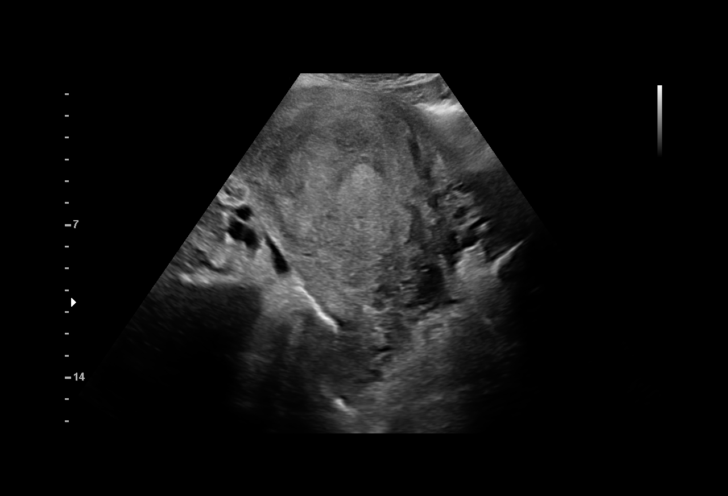
[im 21/34]
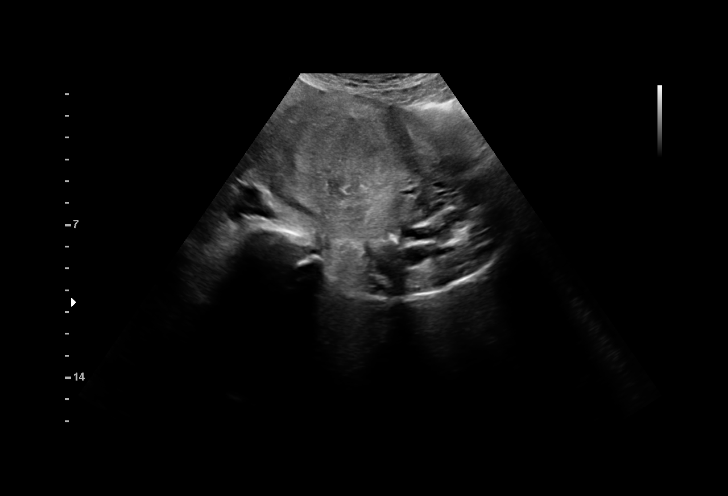
[im 24/34]
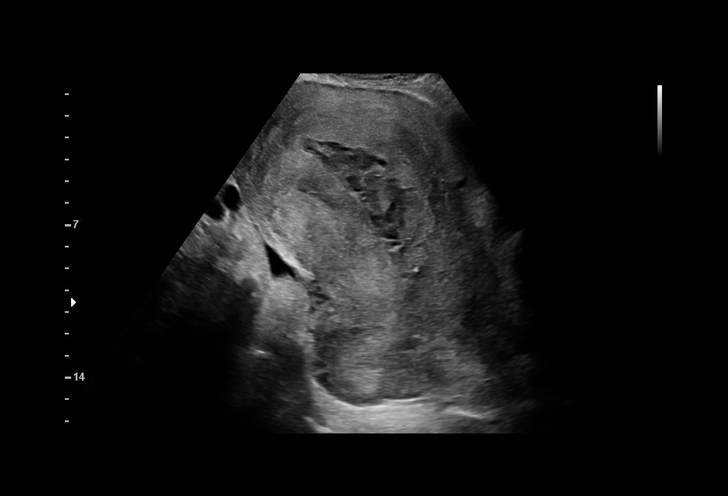
[im 27/34]
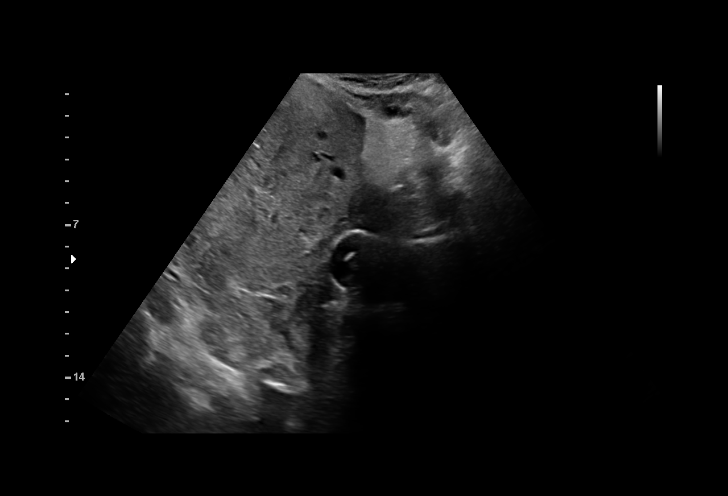
[im 28/34]
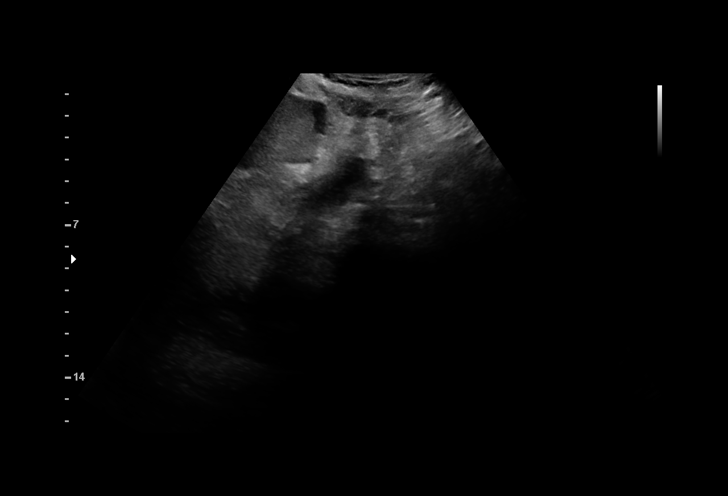
[im 31/34]
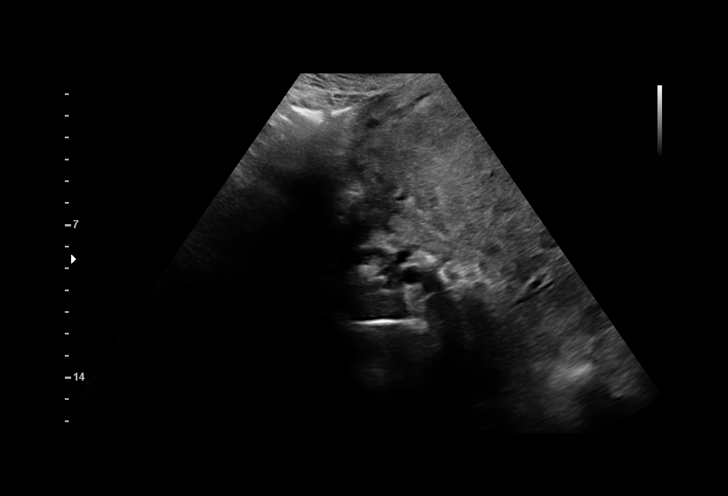
[im 34/34]
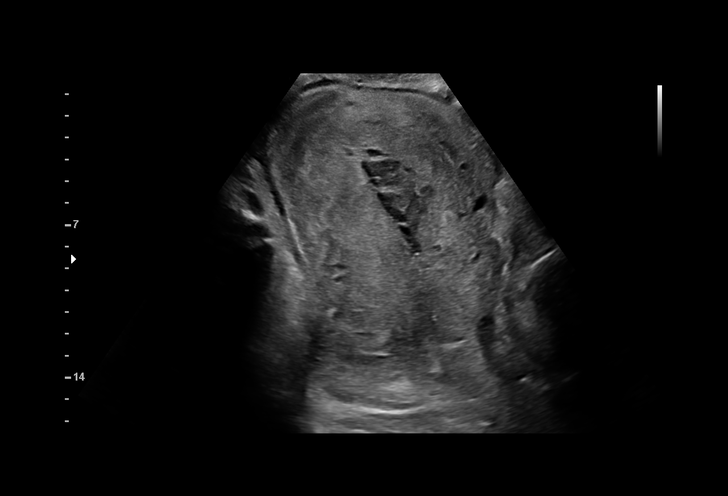

[15 of 25 positions shown; findings below may reference images not displayed]

FINDINGS: Uterus

Measurements: 15.4 x 9.3 x 10.8 cm. No fibroids or other mass
visualized.

Endometrium

Thickness: 28.4 cm.. Heterogenous fluid is present within the
endometrial canal. No hyperemia on color flow imaging.

Right ovary

Nonvisualized

Left ovary

Nonvisualized

Other findings:  No abnormal free fluid.
IMPRESSION: 1. Thickened endometrial stripe consistent with recent postpartum
status. Heterogenous/complex collection within the endometrial canal
could relate to hematoma as this is avascular, however retained
products of conception may also be avascular and therefore cannot be
excluded.

## 2018-02-20 ENCOUNTER — Telehealth: Payer: Self-pay | Admitting: Cardiology

## 2018-02-20 NOTE — Telephone Encounter (Signed)
New Message:     Per staff msgs the pt would like to have and update ECG on the date of her appt 10/10.

## 2018-02-20 NOTE — Telephone Encounter (Signed)
Will send as an FYI to [employer] and covering CMA.

## 2018-03-07 ENCOUNTER — Encounter: Payer: Self-pay | Admitting: Cardiology

## 2018-03-10 ENCOUNTER — Other Ambulatory Visit: Payer: Self-pay | Admitting: Cardiology

## 2018-03-23 ENCOUNTER — Encounter: Payer: Self-pay | Admitting: Cardiology

## 2018-03-23 ENCOUNTER — Ambulatory Visit (INDEPENDENT_AMBULATORY_CARE_PROVIDER_SITE_OTHER): Payer: BLUE CROSS/BLUE SHIELD | Admitting: Cardiology

## 2018-03-23 VITALS — BP 122/60 | HR 68 | Ht 66.0 in | Wt 115.8 lb

## 2018-03-23 DIAGNOSIS — R002 Palpitations: Secondary | ICD-10-CM | POA: Diagnosis not present

## 2018-03-23 DIAGNOSIS — R000 Tachycardia, unspecified: Secondary | ICD-10-CM

## 2018-03-23 MED ORDER — METOPROLOL SUCCINATE ER 25 MG PO TB24: 25.0000 mg | ORAL_TABLET | Freq: Every day | ORAL | 3 refills | Status: DC

## 2018-03-23 NOTE — Progress Notes (Signed)
Cardiology Office Note   Date:  03/23/2018   ID:  Chalon Zobrist, DOB July 14, 1980, MRN [id number]  PCP:  System, Pcp Not In  Cardiologist:  Dr. Anne Fu    Chief Complaint  Patient presents with  . Tachycardia      History of Present Illness: Gianina Olinde is a 37 y.o. female who presents for tachycardia.   She has a hx of chest pain with ETT without ischemia but noted resting heart rate 104 during test that increased to 206 during exercise. BP 114/78. She was given Toprol 25 mg once a day and had done very well with this medication. She was previously having several palpitations and her heart rate would increase to 170 with just walking and she wanted to start the beta blocker. She felt great on the medication. Her heart rate is better controlled now up to 170 to 180 at peak exercise as compared to > 200 and resting heart rate down 20 beats. EKGs in the past have shown LVH but 2-D echo 2 years ago showed normal LV function with no evidence of LVH.  She has been diagnosed with premature ovary failure and she needs to be placed on HRT.  She has just started the HRT.  The pregnancy was donated egg.  She is no longer breast feeding and was here about adding back the metoprolol.    Per Dr. Elberta Fortis "Tachycardia: Has been on metoprolol postpartum, but would prefer to hold as she is finished with breast feeding and has had low blood pressures. His dizzy at times with fatigue. He has been instructed to drink plenty of water and liberalize salt. Recommended support stockings. She was previously on Toprol-XL 25 mg and tolerated it well. We'll restart that medication at this time.  Today she has completed IVF and no further pregnancies. She wants to go back on the BB, she has only been taking occ.  She would like to take consistently.  No chest pain or SOB, though with exercise she will have this as she has had in the past.  Her HR does get up into the 180s.  She is active with running.     Past Medical History:  Diagnosis Date  . Premature ovarian failure   . Tachycardia   . Vaginal Pap smear, abnormal     Past Surgical History:  Procedure Laterality Date  . GYNECOLOGIC CRYOSURGERY       Current Outpatient Medications  Medication Sig Dispense Refill  . metoprolol succinate (TOPROL-XL) 25 MG 24 hr tablet Take 1 tablet (25 mg total) by mouth daily. Please keep upcoming appt in October for future refills. Thank you 30 tablet 0  . Multiple Vitamin (MULTIVITAMIN) capsule Take 1 capsule by mouth daily.    . SPRINTEC 28 0.25-35 MG-MCG tablet Take 1 tablet by mouth daily.      No current facility-administered medications for this visit.     Allergies:   Patient has no known allergies.    Social History:  The patient  reports that she has never smoked. She has never used smokeless tobacco. She reports that she drinks alcohol. She reports that she does not use drugs.   Family History:  The patient's family history includes Diabetes in her maternal grandfather, maternal grandmother, and mother; Heart attack in her paternal grandfather; Heart disease in her maternal grandfather, maternal grandmother, and paternal grandfather; Hypertension in her mother; Thyroid disease in her maternal grandmother; Varicose Veins in her mother.    ROS:  General:no  colds or fevers, no weight changes Skin:no rashes or ulcers HEENT:no blurred vision, no congestion CV:see HPI PUL:see HPI GI:no diarrhea constipation or melena, no indigestion GU:no hematuria, no dysuria MS:no joint pain, no claudication Neuro:no syncope, no lightheadedness Endo:no diabetes, no thyroid disease  Wt Readings from Last 3 Encounters:  03/23/18 115 lb 12.8 oz (52.5 kg)  02/07/17 116 lb (52.6 kg)  01/20/17 115 lb 12.8 oz (52.5 kg)     PHYSICAL EXAM: VS:  BP 122/60   Pulse 68   Ht 5\' 6"  (1.676 m)   Wt 115 lb 12.8 oz (52.5 kg)   SpO2 99%   BMI 18.69 kg/m  , BMI Body mass index is 18.69  kg/m. General:Pleasant affect, NAD Skin:Warm and dry, brisk capillary refill HEENT:normocephalic, sclera clear, mucus membranes moist Neck:supple, no JVD, no bruits  Heart:S1S2 RRR without murmur, gallup, rub or click Lungs:clear without rales, rhonchi, or wheezes ZOX:WRUE, non tender, + BS, do not palpate liver spleen or masses Ext:no lower ext edema, 2+ pedal pulses, 2+ radial pulses Neuro:alert and oriented X 3, MAE, follows commands, + facial symmetry    EKG:  EKG is ordered today. The ekg ordered today demonstrates SR PR 110 ms   Recent Labs: No results found for requested labs within last 8760 hours.    Lipid Panel No results found for: CHOL, TRIG, HDL, CHOLHDL, VLDL, LDLCALC, LDLDIRECT     Other studies Reviewed: Additional studies/ records that were reviewed today include: . Echo 06/25/14 Study Conclusions  - Left ventricle: The cavity size was normal. Wall thickness was normal. Systolic function was normal. The estimated ejection fraction was in the range of 55% to 60%. Wall motion was normal; there were no regional wall motion abnormalities. Left ventricular diastolic function parameters were normal. - Left atrium: The atrium was mildly dilated.  Impressions:  - Normal LV function; mild LAE; trace MR; mild TR.  ASSESSMENT AND PLAN:  1.  Tachycardia she has been on BB episodically but not would like to resume daily.  She will not do anymore IVF.  She is on HRT.  She knows to liberalize salt and drink plenty of water.  Support stockings as well.   She will call if BP low --she will follow up with Dr. Elberta Fortis    Current medicines are reviewed with the patient today.  The patient Has no concerns regarding medicines.  The following changes have been made:  See above Labs/ tests ordered today include:see above  Disposition:   FU:  see above  Signed, Nada Boozer, NP  03/23/2018 9:28 AM    [facility] [address] [place],  [place], [place]  [address]/ [address] [place], [place] Phone: [phone]; Fax: [phone]  [phone]

## 2018-03-23 NOTE — Patient Instructions (Signed)
Medication Instructions:  1. A REFILL HAS BEEN SENT IN FOR METOPROLOL SUCCINATE 25 MG TAKE 1 TABLET DAILY If you need a refill on your cardiac medications before your next appointment, please call your pharmacy.   Lab work: NONE ORDERED TODAY If you have labs (blood work) drawn today and your tests are completely normal, you will receive your results only by: Marland Kitchen MyChart Message (if you have MyChart) OR . A paper copy in the mail If you have any lab test that is abnormal or we need to change your treatment, we will call you to review the results.  Testing/Procedures: NONE ORDERED TODAY  Follow-Up: At [facility], you and your health needs are our priority.  As part of our continuing mission to provide you with exceptional heart care, we have created designated Provider Care Teams.  These Care Teams include your primary Cardiologist (physician) and Advanced Practice Providers (APPs -  Physician Assistants and Nurse Practitioners) who all work together to provide you with the care you need, when you need it. You will need a follow up appointment in 4 MONTHS. Please call our office 2 months in advance to schedule this appointment.  You may see DR. CAMNITZ or one of the following Advanced Practice Providers on your designated Care Team:    Any Other Special Instructions Will Be Listed Below (If Applicable). 1.INCREASE SALT INTAKE; LIBERALLY    2. INCREASING FLUID INTAKE   3. PICK SOME OTC COMPRESSION STOCKINGS. TRY TO WEAR FROM THE TIME YOU WAKE UP UNTIL THE TIME YOU GO TO BED

## 2018-04-19 DIAGNOSIS — Z13220 Encounter for screening for lipoid disorders: Secondary | ICD-10-CM | POA: Diagnosis not present

## 2018-04-19 DIAGNOSIS — Z131 Encounter for screening for diabetes mellitus: Secondary | ICD-10-CM | POA: Diagnosis not present

## 2018-04-20 DIAGNOSIS — K219 Gastro-esophageal reflux disease without esophagitis: Secondary | ICD-10-CM | POA: Diagnosis not present

## 2018-04-20 DIAGNOSIS — R194 Change in bowel habit: Secondary | ICD-10-CM | POA: Diagnosis not present

## 2018-06-05 DIAGNOSIS — K219 Gastro-esophageal reflux disease without esophagitis: Secondary | ICD-10-CM | POA: Diagnosis not present

## 2018-06-05 DIAGNOSIS — R194 Change in bowel habit: Secondary | ICD-10-CM | POA: Diagnosis not present

## 2018-06-16 ENCOUNTER — Other Ambulatory Visit (HOSPITAL_COMMUNITY)
Admission: RE | Admit: 2018-06-16 | Discharge: 2018-06-16 | Disposition: A | Discharge status: Home / Self Care | Payer: BLUE CROSS/BLUE SHIELD | Source: Ambulatory Visit | Attending: Obstetrics and Gynecology | Admitting: Obstetrics and Gynecology

## 2018-06-16 ENCOUNTER — Other Ambulatory Visit: Payer: Self-pay | Admitting: Obstetrics and Gynecology

## 2018-06-16 DIAGNOSIS — Z01419 Encounter for gynecological examination (general) (routine) without abnormal findings: Secondary | ICD-10-CM | POA: Diagnosis not present

## 2018-06-20 LAB — CYTOLOGY - PAP: DIAGNOSIS: NEGATIVE

## 2018-06-30 DIAGNOSIS — R197 Diarrhea, unspecified: Secondary | ICD-10-CM | POA: Diagnosis not present

## 2018-07-19 ENCOUNTER — Encounter: Payer: Self-pay | Admitting: *Deleted

## 2018-07-19 ENCOUNTER — Encounter: Payer: Self-pay | Admitting: Cardiology

## 2018-07-19 ENCOUNTER — Ambulatory Visit (INDEPENDENT_AMBULATORY_CARE_PROVIDER_SITE_OTHER): Payer: BLUE CROSS/BLUE SHIELD | Admitting: Cardiology

## 2018-07-19 VITALS — BP 108/56 | HR 85 | Ht 66.0 in | Wt 117.0 lb

## 2018-07-19 DIAGNOSIS — R002 Palpitations: Secondary | ICD-10-CM | POA: Diagnosis not present

## 2018-07-19 NOTE — Progress Notes (Signed)
Electrophysiology Office Note   Date:  07/19/2018   ID:  Ellen Summers, DOB 01-11-1981, MRN [id number]  PCP:  Jarrett Soho, PA-C  Cardiologist:  Anne Fu Primary Electrophysiologist:  Hriday Stai Jorja Loa, MD    No chief complaint on file.    History of Present Illness: Ellen Summers is a 38 y.o. female who is being seen today for the evaluation of tachycardia at the request of Nada Boozer. Presenting today for electrophysiology evaluation. She has a history of chest pain with a negative ETT. Her resting heart rate at that time was 104 and it did increase to 206 during exertion. She was given Toprol 25 mg daily and has done well with this. She was previously having palpitations with a heart rate of 170 while walking. Her heart rate is better controlled at peak exercise up to 170-180.  Today, denies symptoms of palpitations, chest pain, shortness of breath, orthopnea, PND, lower extremity edema, claudication, dizziness, presyncope, syncope, bleeding, or neurologic sequela. The patient is tolerating medications without difficulties.  Overall she is doing well.  She has no chest pain or shortness of breath.  She is able to do all of her daily activities without issue.   Past Medical History:  Diagnosis Date  . Premature ovarian failure   . Tachycardia   . Vaginal Pap smear, abnormal    Past Surgical History:  Procedure Laterality Date  . GYNECOLOGIC CRYOSURGERY       Current Outpatient Medications  Medication Sig Dispense Refill  . metoprolol succinate (TOPROL-XL) 25 MG 24 hr tablet Take 1 tablet (25 mg total) by mouth daily. 90 tablet 3  . Multiple Vitamin (MULTIVITAMIN) capsule Take 1 capsule by mouth daily.    . SPRINTEC 28 0.25-35 MG-MCG tablet Take 1 tablet by mouth daily.      No current facility-administered medications for this visit.     Allergies:   Patient has no known allergies.   Social History:  The patient  reports that she has never smoked. She has  never used smokeless tobacco. She reports current alcohol use. She reports that she does not use drugs.   Family History:  The patient's family history includes Diabetes in her maternal grandfather, maternal grandmother, and mother; Heart attack in her paternal grandfather; Heart disease in her maternal grandfather, maternal grandmother, and paternal grandfather; Hypertension in her mother; Thyroid disease in her maternal grandmother; Varicose Veins in her mother.    ROS:  Please see the history of present illness.   Otherwise, review of systems is positive for none.   All other systems are reviewed and negative.   PHYSICAL EXAM: VS:  BP (!) 108/56   Pulse 85   Ht 5\' 6"  (1.676 m)   Wt 117 lb (53.1 kg)   BMI 18.88 kg/m  , BMI Body mass index is 18.88 kg/m. GEN: Well nourished, well developed, in no acute distress  HEENT: normal  Neck: no JVD, carotid bruits, or masses Cardiac: RRR; no murmurs, rubs, or gallops,no edema  Respiratory:  clear to auscultation bilaterally, normal work of breathing GI: soft, nontender, nondistended, + BS MS: no deformity or atrophy  Skin: warm and dry Neuro:  Strength and sensation are intact Psych: euthymic mood, full affect  EKG:  EKG is not ordered today. Personal review of the ekg ordered 03/23/18 shows sinus rhythm, short PR, rate 61  No data found.  Recent Labs: No results found for requested labs within last 8760 hours.    Lipid Panel  No  results found for: CHOL, TRIG, HDL, CHOLHDL, VLDL, LDLCALC, LDLDIRECT   Wt Readings from Last 3 Encounters:  07/19/18 117 lb (53.1 kg)  03/23/18 115 lb 12.8 oz (52.5 kg)  02/07/17 116 lb (52.6 kg)      Other studies Reviewed: Additional studies/ records that were reviewed today include: TTE 06/25/16  Review of the above records today demonstrates:  - Left ventricle: The cavity size was normal. Wall thickness was normal. Systolic function was normal. The estimated ejection fraction was in the  range of 55% to 60%. Wall motion was normal; there were no regional wall motion abnormalities. Left ventricular diastolic function parameters were normal. - Left atrium: The atrium was mildly dilated.   ASSESSMENT AND PLAN:  1.  Tachycardia: She has been on metoprolol and is done well.  She has noted no further episodes of tachycardia.  She is able to exercise without issue.  No changes at this time.     Current medicines are reviewed at length with the patient today.   The patient does not have concerns regarding her medicines.  The following changes were made today: None  Labs/ tests ordered today include:  No orders of the defined types were placed in this encounter.    Disposition:   FU with Anessa Charley 12 months  Signed, Mirenda Baltazar Jorja LoaMartin Bryndon Cumbie, MD  07/19/2018 11:59 AM     [facility] [address] [place] [address] [phone] (office) [phone] (fax)

## 2018-07-19 NOTE — Patient Instructions (Signed)
Medication Instructions:  Your physician recommends that you continue on your current medications as directed. Please refer to the Current Medication list given to you today.  * If you need a refill on your cardiac medications before your next appointment, please call your pharmacy.   Labwork: None ordered  Testing/Procedures: None ordered  Follow-Up: Your physician wants you to follow-up in: 1 year with Dr. Camnitz.  You will receive a reminder letter in the mail two months in advance. If you don't receive a letter, please call our office to schedule the follow-up appointment.  Thank you for choosing [facility]!!   Chele Cornell, RN [phone]        

## 2018-08-14 DIAGNOSIS — L700 Acne vulgaris: Secondary | ICD-10-CM | POA: Diagnosis not present

## 2018-11-29 ENCOUNTER — Other Ambulatory Visit: Payer: Self-pay | Admitting: Cardiology

## 2018-11-29 MED ORDER — METOPROLOL SUCCINATE ER 25 MG PO TB24: 25.0000 mg | ORAL_TABLET | Freq: Every day | ORAL | 0 refills | Status: DC

## 2018-12-26 DIAGNOSIS — N952 Postmenopausal atrophic vaginitis: Secondary | ICD-10-CM | POA: Diagnosis not present

## 2018-12-26 DIAGNOSIS — E2839 Other primary ovarian failure: Secondary | ICD-10-CM | POA: Diagnosis not present

## 2019-03-06 DIAGNOSIS — R6882 Decreased libido: Secondary | ICD-10-CM | POA: Diagnosis not present

## 2019-03-06 DIAGNOSIS — E2839 Other primary ovarian failure: Secondary | ICD-10-CM | POA: Diagnosis not present

## 2019-03-06 DIAGNOSIS — N951 Menopausal and female climacteric states: Secondary | ICD-10-CM | POA: Diagnosis not present

## 2019-04-10 ENCOUNTER — Other Ambulatory Visit: Payer: Self-pay | Admitting: Cardiology

## 2019-05-12 ENCOUNTER — Other Ambulatory Visit: Payer: Self-pay | Admitting: Cardiology

## 2019-05-31 DIAGNOSIS — Z20828 Contact with and (suspected) exposure to other viral communicable diseases: Secondary | ICD-10-CM | POA: Diagnosis not present

## 2019-06-04 ENCOUNTER — Other Ambulatory Visit: Payer: Self-pay | Admitting: Cardiology

## 2019-06-13 ENCOUNTER — Telehealth: Payer: Self-pay | Admitting: Cardiology

## 2019-06-18 ENCOUNTER — Ambulatory Visit (INDEPENDENT_AMBULATORY_CARE_PROVIDER_SITE_OTHER): Payer: BC Managed Care – PPO | Admitting: Cardiology

## 2019-06-18 ENCOUNTER — Encounter: Payer: Self-pay | Admitting: Cardiology

## 2019-06-18 ENCOUNTER — Other Ambulatory Visit: Payer: Self-pay

## 2019-06-18 VITALS — BP 110/70 | HR 58 | Ht 66.0 in | Wt 119.4 lb

## 2019-06-18 DIAGNOSIS — R002 Palpitations: Secondary | ICD-10-CM | POA: Diagnosis not present

## 2019-06-18 DIAGNOSIS — R000 Tachycardia, unspecified: Secondary | ICD-10-CM

## 2019-06-18 MED ORDER — METOPROLOL SUCCINATE ER 25 MG PO TB24: ORAL_TABLET | ORAL | 3 refills | Status: DC

## 2019-06-18 NOTE — Patient Instructions (Signed)
Medication Instructions:  The current medical regimen is effective;  continue present plan and medications.  *If you need a refill on your cardiac medications before your next appointment, please call your pharmacy*  Follow-Up: At [facility], you and your health needs are our priority.  As part of our continuing mission to provide you with exceptional heart care, we have created designated Provider Care Teams.  These Care Teams include your primary Cardiologist (physician) and Advanced Practice Providers (APPs -  Physician Assistants and Nurse Practitioners) who all work together to provide you with the care you need, when you need it.  Your next appointment:   12 month(s)  The format for your next appointment:   In Person  Provider:   Mark Skains, MD   Thank you for choosing [facility]!!     

## 2019-06-18 NOTE — Progress Notes (Signed)
Cardiology Office Note:    Date:  06/18/2019   ID:  Ellen Summers, DOB 12-08-1980, MRN [id number]  PCP:  Jarrett Soho, PA-C  Cardiologist:  Donato Schultz, MD  Electrophysiologist:  None   Referring MD: Jarrett Soho, PA-C     History of Present Illness:    Ellen Summers is a 39 y.o. female here for the follow-up of palpitations.  Was seen in the past by Dr. Elberta Fortis.  Has a history of chest pain with negative treadmill test.  Resting heart rate at times was 104 and increased to 206 during exertion.  She was given Toprol 25 mg and did well.  Peak exercise 170 -180 when walking.  Overall she is feeling well with the medication.  No complaints.  Prior EKG on 03/23/2018 shows sinus rhythm short PR rate 61.  Echocardiogram 2018 showed EF of 60%.  Left atrium mildly dilated.  Past Medical History:  Diagnosis Date  . Premature ovarian failure   . Tachycardia   . Vaginal Pap smear, abnormal     Past Surgical History:  Procedure Laterality Date  . GYNECOLOGIC CRYOSURGERY      Current Medications: Current Meds  Medication Sig  . medroxyPROGESTERone (PROVERA) 10 MG tablet Take 10 mg by mouth daily.  . metoprolol succinate (TOPROL-XL) 25 MG 24 hr tablet TAKE 1 TABLET DAILY (MAKE OVERDUE APPOINTMENT WITH DR Anne Fu FOR REFILLS [phone] SECOND ATTEMPT)  . Multiple Vitamin (MULTIVITAMIN) capsule Take 1 capsule by mouth daily.  . Omega-3 Fatty Acids (OMEGA-3 EPA FISH OIL PO) Take 1 capsule by mouth daily.     Allergies:   Patient has no known allergies.   Social History   Socioeconomic History  . Marital status: Married    Spouse name: Not on file  . Number of children: Not on file  . Years of education: Not on file  . Highest education level: Not on file  Occupational History  . Not on file  Tobacco Use  . Smoking status: Never Smoker  . Smokeless tobacco: Never Used  Substance and Sexual Activity  . Alcohol use: Yes    Comment: 1-2 drinks per week  . Drug  use: No  . Sexual activity: Not on file  Other Topics Concern  . Not on file  Social History Narrative  . Not on file   Social Determinants of Health   Financial Resource Strain:   . Difficulty of Paying Living Expenses: Not on file  Food Insecurity:   . Worried About Programme researcher, broadcasting/film/video in the Last Year: Not on file  . Ran Out of Food in the Last Year: Not on file  Transportation Needs:   . Lack of Transportation (Medical): Not on file  . Lack of Transportation (Non-Medical): Not on file  Physical Activity:   . Days of Exercise per Week: Not on file  . Minutes of Exercise per Session: Not on file  Stress:   . Feeling of Stress : Not on file  Social Connections:   . Frequency of Communication with Friends and Family: Not on file  . Frequency of Social Gatherings with Friends and Family: Not on file  . Attends Religious Services: Not on file  . Active Member of Clubs or Organizations: Not on file  . Attends Banker Meetings: Not on file  . Marital Status: Not on file     Family History: The patient's family history includes Diabetes in her maternal grandfather, maternal grandmother, and mother; Heart attack in her paternal  grandfather; Heart disease in her maternal grandfather, maternal grandmother, and paternal grandfather; Hypertension in her mother; Thyroid disease in her maternal grandmother; Varicose Veins in her mother. There is no history of Coronary artery disease.  ROS:   Please see the history of present illness.     All other systems reviewed and are negative.  EKGs/Labs/Other Studies Reviewed:    The following studies were reviewed today: Prior echo, EKG  Outside Covid test at [facility] on 05/31/2019 was negative  EKG:  EKG is ordered today.  The ekg ordered today demonstrates 06/18/2019-sinus rhythm 58 no other abnormalities.  Recent Labs: No results found for requested labs within last 8760 hours.  Recent Lipid Panel No results found  for: CHOL, TRIG, HDL, CHOLHDL, VLDL, LDLCALC, LDLDIRECT  Physical Exam:    VS:  BP 110/70   Pulse (!) 58   Ht 5\' 6"  (1.676 m)   Wt 119 lb 6.4 oz (54.2 kg)   SpO2 99%   BMI 19.27 kg/m     Wt Readings from Last 3 Encounters:  06/18/19 119 lb 6.4 oz (54.2 kg)  07/19/18 117 lb (53.1 kg)  03/23/18 115 lb 12.8 oz (52.5 kg)     GEN: Thin in no acute distress HEENT: Normal NECK: No JVD; No carotid bruits LYMPHATICS: No lymphadenopathy CARDIAC: RRR, no murmurs, rubs, gallops RESPIRATORY:  Clear to auscultation without rales, wheezing or rhonchi  ABDOMEN:  non-distended MUSCULOSKELETAL:  No edema; No deformity  SKIN: Warm and dry NEUROLOGIC:  Alert and oriented x 3 PSYCHIATRIC:  Normal affect   ASSESSMENT:    No diagnosis found. PLAN:    In order of problems listed above:  Tachycardia/palpitations -Doing well on metoprolol succinate 25 mg a day.  Excellent EKG.  No changes made.  Lab work is being followed at work on an annual basis.   Medication Adjustments/Labs and Tests Ordered: Current medicines are reviewed at length with the patient today.  Concerns regarding medicines are outlined above.  No orders of the defined types were placed in this encounter.  No orders of the defined types were placed in this encounter.   There are no Patient Instructions on file for this visit.   Signed, Candee Furbish, MD  06/18/2019 9:50 AM    [facility]

## 2019-06-20 DIAGNOSIS — E288 Other ovarian dysfunction: Secondary | ICD-10-CM | POA: Diagnosis not present

## 2019-06-20 DIAGNOSIS — Z01419 Encounter for gynecological examination (general) (routine) without abnormal findings: Secondary | ICD-10-CM | POA: Diagnosis not present

## 2019-06-20 DIAGNOSIS — R6882 Decreased libido: Secondary | ICD-10-CM | POA: Diagnosis not present

## 2019-11-06 DIAGNOSIS — Z20822 Contact with and (suspected) exposure to covid-19: Secondary | ICD-10-CM | POA: Diagnosis not present

## 2020-01-29 DIAGNOSIS — K631 Perforation of intestine (nontraumatic): Secondary | ICD-10-CM | POA: Diagnosis not present

## 2020-02-19 DIAGNOSIS — Z20822 Contact with and (suspected) exposure to covid-19: Secondary | ICD-10-CM | POA: Diagnosis not present

## 2020-02-19 DIAGNOSIS — R05 Cough: Secondary | ICD-10-CM | POA: Diagnosis not present

## 2020-02-19 DIAGNOSIS — R52 Pain, unspecified: Secondary | ICD-10-CM | POA: Diagnosis not present

## 2020-02-19 DIAGNOSIS — R509 Fever, unspecified: Secondary | ICD-10-CM | POA: Diagnosis not present

## 2020-05-13 DIAGNOSIS — F4322 Adjustment disorder with anxiety: Secondary | ICD-10-CM | POA: Diagnosis not present

## 2020-05-13 DIAGNOSIS — F411 Generalized anxiety disorder: Secondary | ICD-10-CM | POA: Diagnosis not present

## 2020-05-21 DIAGNOSIS — F4322 Adjustment disorder with anxiety: Secondary | ICD-10-CM | POA: Diagnosis not present

## 2020-05-21 DIAGNOSIS — F411 Generalized anxiety disorder: Secondary | ICD-10-CM | POA: Diagnosis not present

## 2020-06-03 DIAGNOSIS — F4322 Adjustment disorder with anxiety: Secondary | ICD-10-CM | POA: Diagnosis not present

## 2020-06-03 DIAGNOSIS — F411 Generalized anxiety disorder: Secondary | ICD-10-CM | POA: Diagnosis not present

## 2020-06-17 NOTE — Progress Notes (Signed)
Telehealth Visit     Virtual Visit via Telephone Note   This visit type was conducted due to national recommendations for restrictions regarding the COVID-19 Pandemic (e.g. social distancing) in an effort to limit this patient's exposure and mitigate transmission in our community.  Due to her co-morbid illnesses, this patient is at least at moderate risk for complications without adequate follow up.  This format is felt to be most appropriate for this patient at this time.  The patient did not have access to video technology/had technical difficulties with video requiring transitioning to audio format only (telephone).  All issues noted in this document were discussed and addressed.  No physical exam could be performed with this format.  Please refer to the patient's chart for her  consent to telehealth for [facility]   Evaluation Performed:  Follow-up visit   The patient was identified using 2 identifiers.   This visit type was conducted due to national recommendations for restrictions regarding the COVID-19 Pandemic (e.g. social distancing).  This format is felt to be most appropriate for this patient at this time.  All issues noted in this document were discussed and addressed.  No physical exam was performed (except for noted visual exam findings with Video Visits).  Please refer to the patient's chart (MyChart message for video visits and phone note for telephone visits) for the patient's consent to telehealth for [facility].  Date:  06/18/2020   ID:  Ellen Summers, DOB 06/18/1980, MRN [id number]  Patient Location:  Home  Provider location:   [facility]  PCP:  Jarrett Soho, PA-C  Cardiologist:   Donato Schultz, MD  Electrophysiologist:  None   Chief Complaint:  Follow up  History of Present Illness:    Ellen Summers is a 40 y.o. female who presents via audio/video conferencing for a telehealth visit today.  Seen for Dr. Anne Fu.   She has a history of  palpitations - has seen Dr. Elberta Fortis in the past. Prior chest pain with negative GXT. She is on Toprol. Past echo with normal EF and mild LAE.   Last seen one year ago - doing well.   The patient does not have symptoms concerning for COVID-19 infection (fever, chills, cough, or new shortness of breath).   Seen today by telephone visit. She has consented for this visit. She is doing well. Needs Toprol refilled. No vitals for this visit. Her palpitations are really fine as long as she takes her Metoprolol. BP tends to be fine or on the low side - but not symptomatic. She has no concerns.   Past Medical History:  Diagnosis Date  . Premature ovarian failure   . Tachycardia   . Vaginal Pap smear, abnormal    Past Surgical History:  Procedure Laterality Date  . GYNECOLOGIC CRYOSURGERY       Current Meds  Medication Sig  . medroxyPROGESTERone (PROVERA) 10 MG tablet Take 10 mg by mouth as needed (first 12 days of the month). First 12 days of the month.  . Multiple Vitamin (MULTIVITAMIN) capsule Take 1 capsule by mouth daily.  . Omega-3 Fatty Acids (OMEGA-3 EPA FISH OIL PO) Take 1 capsule by mouth daily.  . [DISCONTINUED] metoprolol succinate (TOPROL-XL) 25 MG 24 hr tablet TAKE 1 TABLET DAILY     Allergies:   Patient has no known allergies.   Social History   Tobacco Use  . Smoking status: Never Smoker  . Smokeless tobacco: Never Used  Vaping Use  . Vaping Use:  Never used  Substance Use Topics  . Alcohol use: Yes    Comment: 1-2 drinks per week  . Drug use: No     Family Hx: The patient's family history includes Diabetes in her maternal grandfather, maternal grandmother, and mother; Heart attack in her paternal grandfather; Heart disease in her maternal grandfather, maternal grandmother, and paternal grandfather; Hypertension in her mother; Thyroid disease in her maternal grandmother; Varicose Veins in her mother. There is no history of Coronary artery disease.  ROS:   Please see  the history of present illness.   All other systems reviewed are negative.    Objective:    Vital Signs:  Ht 5\' 6"  (1.676 m)   Wt 110 lb (49.9 kg)   BMI 17.75 kg/m    Wt Readings from Last 3 Encounters:  06/18/20 110 lb (49.9 kg)  06/18/19 119 lb 6.4 oz (54.2 kg)  07/19/18 117 lb (53.1 kg)    Alert female in no acute distress. Sounds appropriate with conversation.    Labs/Other Tests and Data Reviewed:    Lab Results  Component Value Date   WBC 19.7 (H) 04/10/2016   HGB 9.1 (L) 04/10/2016   HCT 25.4 (L) 04/10/2016   PLT 138 (L) 04/10/2016   INR 1.06 04/10/2016        BNP (last 3 results) No results for input(s): BNP in the last 8760 hours.  ProBNP (last 3 results) No results for input(s): PROBNP in the last 8760 hours.    Prior CV studies:    The following studies were reviewed today:  Echo Study Conclusions 2016  - Left ventricle: The cavity size was normal. Wall thickness was  normal. Systolic function was normal. The estimated ejection  fraction was in the range of 55% to 60%. Wall motion was normal;  there were no regional wall motion abnormalities. Left  ventricular diastolic function parameters were normal.  - Left atrium: The atrium was mildly dilated.   Impressions:   - Normal LV function; mild LAE; trace MR; mild TR.     ASSESSMENT & PLAN:    Tachy palpitations - doing well on low dose Toprol. Refilled this today.   Patient Risk:   After full review of this patient's clinical status, I feel that they are at least moderate risk at this time.  Time:   Today, I have spent 2 minutes with the patient with telehealth technology discussing the above issues.     Medication Adjustments/Labs and Tests Ordered: Current medicines are reviewed at length with the patient today.  Concerns regarding medicines are outlined above.   Tests Ordered: No orders of the defined types were placed in this encounter.   Medication Changes: Meds  ordered this encounter  Medications  . metoprolol succinate (TOPROL-XL) 25 MG 24 hr tablet    Sig: TAKE 1 TABLET DAILY    Dispense:  90 tablet    Refill:  3    Disposition:  FU with Dr. 2017 in one year.    Patient is agreeable to this plan and will call if any problems develop in the interim.   Anne Fu, NP  06/18/2020 9:12 AM    [facility]

## 2020-06-18 ENCOUNTER — Telehealth: Payer: Self-pay | Admitting: *Deleted

## 2020-06-18 ENCOUNTER — Encounter: Payer: Self-pay | Admitting: Nurse Practitioner

## 2020-06-18 ENCOUNTER — Other Ambulatory Visit: Payer: Self-pay

## 2020-06-18 ENCOUNTER — Telehealth (INDEPENDENT_AMBULATORY_CARE_PROVIDER_SITE_OTHER): Payer: BC Managed Care – PPO | Admitting: Nurse Practitioner

## 2020-06-18 VITALS — Ht 66.0 in | Wt 110.0 lb

## 2020-06-18 DIAGNOSIS — R000 Tachycardia, unspecified: Secondary | ICD-10-CM | POA: Diagnosis not present

## 2020-06-18 DIAGNOSIS — R002 Palpitations: Secondary | ICD-10-CM | POA: Diagnosis not present

## 2020-06-18 MED ORDER — METOPROLOL SUCCINATE ER 25 MG PO TB24: ORAL_TABLET | ORAL | 3 refills | Status: DC

## 2020-06-18 NOTE — Telephone Encounter (Signed)
  Patient Consent for Virtual Visit         Ellen Summers has provided verbal consent on 06/18/2020 for a virtual visit (video or telephone).   CONSENT FOR VIRTUAL VISIT FOR:  Ellen Summers  By participating in this virtual visit I agree to the following:  I hereby voluntarily request, consent and authorize [facility] and its employed or contracted physicians, physician assistants, nurse practitioners or other licensed health care professionals (the Practitioner), to provide me with telemedicine health care services (the "Services") as deemed necessary by the treating Practitioner. I acknowledge and consent to receive the Services by the Practitioner via telemedicine. I understand that the telemedicine visit will involve communicating with the Practitioner through live audiovisual communication technology and the disclosure of certain medical information by electronic transmission. I acknowledge that I have been given the opportunity to request an in-person assessment or other available alternative prior to the telemedicine visit and am voluntarily participating in the telemedicine visit.  I understand that I have the right to withhold or withdraw my consent to the use of telemedicine in the course of my care at any time, without affecting my right to future care or treatment, and that the Practitioner or I may terminate the telemedicine visit at any time. I understand that I have the right to inspect all information obtained and/or recorded in the course of the telemedicine visit and may receive copies of available information for a reasonable fee.  I understand that some of the potential risks of receiving the Services via telemedicine include:  Marland Kitchen Delay or interruption in medical evaluation due to technological equipment failure or disruption; . Information transmitted may not be sufficient (e.g. poor resolution of images) to allow for appropriate medical decision making by the  Practitioner; and/or  . In rare instances, security protocols could fail, causing a breach of personal health information.  Furthermore, I acknowledge that it is my responsibility to provide information about my medical history, conditions and care that is complete and accurate to the best of my ability. I acknowledge that Practitioner's advice, recommendations, and/or decision may be based on factors not within their control, such as incomplete or inaccurate data provided by me or distortions of diagnostic images or specimens that may result from electronic transmissions. I understand that the practice of medicine is not an exact science and that Practitioner makes no warranties or guarantees regarding treatment outcomes. I acknowledge that a copy of this consent can be made available to me via my patient portal [facility] MyChart), or I can request a printed copy by calling the office of [facility].    I understand that my insurance will be billed for this visit.   I have read or had this consent read to me. . I understand the contents of this consent, which adequately explains the benefits and risks of the Services being provided via telemedicine.  . I have been provided ample opportunity to ask questions regarding this consent and the Services and have had my questions answered to my satisfaction. . I give my informed consent for the services to be provided through the use of telemedicine in my medical care

## 2020-06-18 NOTE — Patient Instructions (Addendum)
After Visit Summary:  We will be checking the following labs today - NONE   Medication Instructions:    Continue with your current medicines.    If you need a refill on your cardiac medications before your next appointment, please call your pharmacy.     Testing/Procedures To Be Arranged:  N/A  Follow-Up:   See Dr. Skains in one year -  You will receive a reminder letter in the mail two months in advance. If you don't receive a letter, please call our office to schedule the follow-up appointment.     At [facility], you and your health needs are our priority.  As part of our continuing mission to provide you with exceptional heart care, we have created designated Provider Care Teams.  These Care Teams include your primary Cardiologist (physician) and Advanced Practice Providers (APPs -  Physician Assistants and Nurse Practitioners) who all work together to provide you with the care you need, when you need it.  Special Instructions:  . Stay safe, wash your hands for at least 20 seconds and wear a mask when needed.  . It was good to talk with you today.    Call the [facility] office at [phone] if you have any questions, problems or concerns.       

## 2020-06-19 DIAGNOSIS — F4322 Adjustment disorder with anxiety: Secondary | ICD-10-CM | POA: Diagnosis not present

## 2020-06-19 DIAGNOSIS — F411 Generalized anxiety disorder: Secondary | ICD-10-CM | POA: Diagnosis not present

## 2020-06-24 DIAGNOSIS — R6882 Decreased libido: Secondary | ICD-10-CM | POA: Diagnosis not present

## 2020-06-24 DIAGNOSIS — E288 Other ovarian dysfunction: Secondary | ICD-10-CM | POA: Diagnosis not present

## 2020-06-24 DIAGNOSIS — N952 Postmenopausal atrophic vaginitis: Secondary | ICD-10-CM | POA: Diagnosis not present

## 2020-06-24 DIAGNOSIS — Z01419 Encounter for gynecological examination (general) (routine) without abnormal findings: Secondary | ICD-10-CM | POA: Diagnosis not present

## 2020-07-03 DIAGNOSIS — F411 Generalized anxiety disorder: Secondary | ICD-10-CM | POA: Diagnosis not present

## 2020-07-03 DIAGNOSIS — F4322 Adjustment disorder with anxiety: Secondary | ICD-10-CM | POA: Diagnosis not present

## 2020-07-17 DIAGNOSIS — F411 Generalized anxiety disorder: Secondary | ICD-10-CM | POA: Diagnosis not present

## 2020-07-17 DIAGNOSIS — F4322 Adjustment disorder with anxiety: Secondary | ICD-10-CM | POA: Diagnosis not present

## 2020-07-31 DIAGNOSIS — F411 Generalized anxiety disorder: Secondary | ICD-10-CM | POA: Diagnosis not present

## 2020-07-31 DIAGNOSIS — F4322 Adjustment disorder with anxiety: Secondary | ICD-10-CM | POA: Diagnosis not present

## 2020-08-13 DIAGNOSIS — F4322 Adjustment disorder with anxiety: Secondary | ICD-10-CM | POA: Diagnosis not present

## 2020-08-13 DIAGNOSIS — F411 Generalized anxiety disorder: Secondary | ICD-10-CM | POA: Diagnosis not present

## 2020-09-02 DIAGNOSIS — F4322 Adjustment disorder with anxiety: Secondary | ICD-10-CM | POA: Diagnosis not present

## 2020-09-02 DIAGNOSIS — F411 Generalized anxiety disorder: Secondary | ICD-10-CM | POA: Diagnosis not present

## 2020-09-24 DIAGNOSIS — F4322 Adjustment disorder with anxiety: Secondary | ICD-10-CM | POA: Diagnosis not present

## 2020-09-24 DIAGNOSIS — F411 Generalized anxiety disorder: Secondary | ICD-10-CM | POA: Diagnosis not present

## 2020-10-07 DIAGNOSIS — Z20822 Contact with and (suspected) exposure to covid-19: Secondary | ICD-10-CM | POA: Diagnosis not present

## 2020-12-30 DIAGNOSIS — F411 Generalized anxiety disorder: Secondary | ICD-10-CM | POA: Diagnosis not present

## 2020-12-30 DIAGNOSIS — F4322 Adjustment disorder with anxiety: Secondary | ICD-10-CM | POA: Diagnosis not present

## 2021-02-11 DIAGNOSIS — R6882 Decreased libido: Secondary | ICD-10-CM | POA: Diagnosis not present

## 2021-02-24 ENCOUNTER — Telehealth: Payer: Self-pay | Admitting: Cardiology

## 2021-02-24 NOTE — Telephone Encounter (Signed)
Spoke with the pt and she called to report that she has been having right arm numbness and tingling... she says she has been under a lot of stress with her mother in the ICU.. she has had this for several days and yesterday she had some chest tightness that lasted a few minutes.  She denies dizziness, no dyspnea, no palpitations.   I asked if she has had any h/o neck problems but she says she has not,... she works on a computer daily but has not had any symptoms with her neck, back, or shoulder. I advised that it could still be a nerve issue to call her PCP as well for recommendations.   I will forward to Dr., Anne Fu and his nurse for review and recommendations.... unsure of when to make an appt for the pt due to tight availability.

## 2021-02-24 NOTE — Telephone Encounter (Signed)
Pt c/o of Chest Pain: STAT if CP now or developed within 24 hours  1. Are you having CP right now? no  2. Are you experiencing any other symptoms (ex. SOB, nausea, vomiting, sweating)? Tingling sensation in right arm   3. How long have you been experiencing CP? Patient said symptoms lasted less than 10 minutes yesterday.   4. Is your CP continuous or coming and going? Comes and goes   5. Have you taken Nitroglycerin? no ?

## 2021-02-25 NOTE — Telephone Encounter (Signed)
LMTCB

## 2021-03-02 NOTE — Telephone Encounter (Signed)
Patient returning call.

## 2021-03-02 NOTE — Telephone Encounter (Signed)
Spoke with pt who reports she is still having episodes of heart racing. Occasional numbness in her arm but not like before.  Feels regular just fast.  No chest, sob etc.  She has not checked her HR or BP during episodes.  She is taking Metoprolol as ordered daily. Her mother passed away last week and she is very stressed.  Advised to f/u with PCP.  Check HR and BP if possible during the episodes.   Appt scheduled for yearly f/u though pt is aware to call back before if s/s continue.  Condolences given regarding her mother's passing.

## 2021-03-11 DIAGNOSIS — Z13220 Encounter for screening for lipoid disorders: Secondary | ICD-10-CM | POA: Diagnosis not present

## 2021-03-11 DIAGNOSIS — K625 Hemorrhage of anus and rectum: Secondary | ICD-10-CM | POA: Diagnosis not present

## 2021-06-25 ENCOUNTER — Other Ambulatory Visit: Payer: Self-pay | Admitting: Obstetrics and Gynecology

## 2021-06-25 ENCOUNTER — Other Ambulatory Visit (HOSPITAL_COMMUNITY)
Admission: RE | Admit: 2021-06-25 | Discharge: 2021-06-25 | Disposition: A | Discharge status: Home / Self Care | Payer: BC Managed Care – PPO | Source: Ambulatory Visit | Attending: Obstetrics and Gynecology | Admitting: Obstetrics and Gynecology

## 2021-06-25 DIAGNOSIS — Z01419 Encounter for gynecological examination (general) (routine) without abnormal findings: Secondary | ICD-10-CM | POA: Insufficient documentation

## 2021-06-25 DIAGNOSIS — E288 Other ovarian dysfunction: Secondary | ICD-10-CM | POA: Diagnosis not present

## 2021-06-25 DIAGNOSIS — R6882 Decreased libido: Secondary | ICD-10-CM | POA: Diagnosis not present

## 2021-06-25 DIAGNOSIS — N952 Postmenopausal atrophic vaginitis: Secondary | ICD-10-CM | POA: Diagnosis not present

## 2021-06-29 LAB — CYTOLOGY - PAP
Comment: NEGATIVE
Diagnosis: NEGATIVE
Diagnosis: REACTIVE
High risk HPV: NEGATIVE

## 2021-07-03 NOTE — Progress Notes (Addendum)
Cardiology Office Note:    Date:  07/07/2021   ID:  Ellen Summers, DOB 1981-04-24, MRN [id number]  PCP:  Jarrett SohoWharton, Courtney, PA-C  Cardiologist:  Donato SchultzMark Shalonda Sachse, MD  Electrophysiologist:  None   Referring MD: Jarrett SohoWharton, Courtney, PA-C   History of Present Illness:    Ellen Summers is a 41 y.o. female here for the follow-up of palpitations and racing heart rate.  She called the office 02/24/2021 and reported RUE numbness/tingling for several days. She had chest tightness the day prior lasting for a few minutes. On 03/02/2021 she reported continued episodes of racing heart beats and milder RUE numbness in the setting of significant stress due to her mother passing away after being in the ICU one week prior. She was compliant with metoprolol daily.  Was seen in the past by Dr. Elberta Fortisamnitz.  Has a history of chest pain with negative treadmill test.  Resting heart rate at times was 104 and increased to 206 during exertion.  She was given Toprol 25 mg and did well.  Peak exercise 170 -180 when walking.  Prior EKG on 03/23/2018 shows sinus rhythm short PR rate 61.  Echocardiogram 2018 showed EF of 60%.  Left atrium mildly dilated.  At her last appointment she was feeling well and tolerating her medication.  No complaints.  Today: Overall, she appears well. She is here to make sure her cardiovascular health is stable, indicated by her family history. Her mother had atrial fibrillation and a significant blockage of her carotid artery. Her maternal grandmother had same issue. Her maternal uncle had a stroke due to a blockage in his carotid artery.  With exercise her heart rate may be as high as the 180s to low 190s. However, she denies any similar issues with chest pain or lightheadedness like she did prior.  In 02/2021, she reports her metoprolol worked very well to control her palpitations, and continues to do so.   She has blood work done annually.  She denies any or shortness of breath. No  headaches, syncope, orthopnea, PND, lower extremity edema or exertional symptoms.   Past Medical History:  Diagnosis Date   Premature ovarian failure    Tachycardia    Vaginal Pap smear, abnormal     Past Surgical History:  Procedure Laterality Date   GYNECOLOGIC CRYOSURGERY      Current Medications: Current Meds  Medication Sig   CLIMARA 0.05 MG/24HR patch Place 0.05 mg onto the skin once a week.   medroxyPROGESTERone (PROVERA) 10 MG tablet Take 10 mg by mouth as needed (first 12 days of the month). First 12 days of the month.   metoprolol succinate (TOPROL-XL) 25 MG 24 hr tablet TAKE 1 TABLET DAILY   Multiple Vitamin (MULTIVITAMIN) capsule Take 1 capsule by mouth daily.   Omega-3 Fatty Acids (OMEGA-3 EPA FISH OIL PO) Take 1 capsule by mouth daily.     Allergies:   Patient has no known allergies.   Social History   Socioeconomic History   Marital status: Married    Spouse name: Not on file   Number of children: Not on file   Years of education: Not on file   Highest education level: Not on file  Occupational History   Not on file  Tobacco Use   Smoking status: Never   Smokeless tobacco: Never  Vaping Use   Vaping Use: Never used  Substance and Sexual Activity   Alcohol use: Yes    Comment: 1-2 drinks per week   Drug use:  No   Sexual activity: Not on file  Other Topics Concern   Not on file  Social History Narrative   Not on file   Social Determinants of Health   Financial Resource Strain: Not on file  Food Insecurity: Not on file  Transportation Needs: Not on file  Physical Activity: Not on file  Stress: Not on file  Social Connections: Not on file     Family History: The patient's family history includes Diabetes in her maternal grandfather, maternal grandmother, and mother; Heart attack in her paternal grandfather; Heart disease in her maternal grandfather, maternal grandmother, and paternal grandfather; Hypertension in her mother; Thyroid disease in  her maternal grandmother; Varicose Veins in her mother. There is no history of Coronary artery disease.  ROS:   Please see the history of present illness.    All other systems reviewed and are negative.  EKGs/Labs/Other Studies Reviewed:    The following studies were reviewed today: Prior echo, EKG  Echo 06/25/2014: Notes Recorded by Dyann Kief, PA-C on 06/26/2014 at 7:48 AM Normal LV function. Only trace of a murmur. No LVH or thickening of the heart.  Outside Covid test at [facility] on 05/31/2019 was negative  EKG:  EKG is personally reviewed and interpreted. 07/07/2021: Sinus rhythm. Rate 62 bpm. 06/18/2019: sinus rhythm 58 no other abnormalities.  Recent Labs: No results found for requested labs within last 8760 hours.   Recent Lipid Panel No results found for: CHOL, TRIG, HDL, CHOLHDL, VLDL, LDLCALC, LDLDIRECT  Physical Exam:    VS:  BP 100/70 (BP Location: Left Arm, Patient Position: Sitting, Cuff Size: Normal)    Pulse 62    Ht 5\' 6"  (1.676 m)    Wt 110 lb (49.9 kg)    SpO2 99%    BMI 17.75 kg/m     Wt Readings from Last 3 Encounters:  07/07/21 110 lb (49.9 kg)  06/18/20 110 lb (49.9 kg)  06/18/19 119 lb 6.4 oz (54.2 kg)     GEN: Thin in no acute distress HEENT: Normal NECK: No JVD; No carotid bruits LYMPHATICS: No lymphadenopathy CARDIAC: RRR, no murmurs, rubs, gallops RESPIRATORY:  Clear to auscultation without rales, wheezing or rhonchi  ABDOMEN:  non-distended MUSCULOSKELETAL:  No edema; No deformity  SKIN: Warm and dry NEUROLOGIC:  Alert and oriented x 3 PSYCHIATRIC:  Normal affect   ASSESSMENT:    1. Palpitations   2. Family history of carotid endarterectomy     PLAN:    In order of problems listed above:  Family history of carotid endarterectomy Per mother had carotid endarterectomy with normal cholesterol levels.  We will go ahead and check a coronary calcium score.  If 0 continue with current prevention strategy.  May wish to check  it again at approximately 41 years old.  Tachycardia Doing well with low-dose Toprol 25 mg once a day.  No changes made.  During exercise heart rates will get to the 180s to 190s but not to the 210 range.  She no longer feels the dizziness.  Continue with adequate hydration.  Palpitations No excessive scabbing noted.  Surrounding her mother's passing last year, she did feel some left arm tingling.  This worried her.  She is now feeling better.    Follow-up:  1 year.   Medication Adjustments/Labs and Tests Ordered: Current medicines are reviewed at length with the patient today.  Concerns regarding medicines are outlined above.   Orders Placed This Encounter  Procedures   CT  CARDIAC SCORING (SELF PAY ONLY)   EKG 12-Lead   No orders of the defined types were placed in this encounter.  Patient Instructions  Medication Instructions:  The current medical regimen is effective;  continue present plan and medications.  *If you need a refill on your cardiac medications before your next appointment, please call your pharmacy*  Testing/Procedures: Your physician has requested that you have a Coronary Calcium score which is completed by CT. Cardiac computed tomography (CT) is a painless test that uses an x-ray machine to take clear, detailed pictures of your heart. There are no instructions for this testing.  You may eat/drink and take your normal medications this day.  The cost of the testing is $99 due at the time of your appointment.  Follow-Up: At [facility], you and your health needs are our priority.  As part of our continuing mission to provide you with exceptional heart care, we have created designated Provider Care Teams.  These Care Teams include your primary Cardiologist (physician) and Advanced Practice Providers (APPs -  Physician Assistants and Nurse Practitioners) who all work together to provide you with the care you need, when you need it.  We recommend signing up for the  patient portal called "MyChart".  Sign up information is provided on this After Visit Summary.  MyChart is used to connect with patients for Virtual Visits (Telemedicine).  Patients are able to view lab/test results, encounter notes, upcoming appointments, etc.  Non-urgent messages can be sent to your provider as well.   To learn more about what you can do with MyChart, go to [email or web address].    Your next appointment:   1 year(s)  The format for your next appointment:   In Person  Provider:   Donato Schultz, MD     Thank you for choosing [facility]!!      I,Mathew Stumpf,acting as a scribe for Donato Schultz, MD.,have documented all relevant documentation on the behalf of Donato Schultz, MD,as directed by  Donato Schultz, MD while in the presence of Donato Schultz, MD.  I, Donato Schultz, MD, have reviewed all documentation for this visit. The documentation on 07/07/21 for the exam, diagnosis, procedures, and orders are all accurate and complete.   Signed, Donato Schultz, MD  07/07/2021 9:10 AM    [facility]

## 2021-07-07 ENCOUNTER — Encounter: Payer: Self-pay | Admitting: Cardiology

## 2021-07-07 ENCOUNTER — Ambulatory Visit
Admission: RE | Admit: 2021-07-07 | Discharge: 2021-07-07 | Disposition: A | Discharge status: Home / Self Care | Payer: Self-pay | Source: Ambulatory Visit | Attending: Cardiology | Admitting: Cardiology

## 2021-07-07 ENCOUNTER — Other Ambulatory Visit: Payer: Self-pay

## 2021-07-07 ENCOUNTER — Ambulatory Visit (INDEPENDENT_AMBULATORY_CARE_PROVIDER_SITE_OTHER): Payer: BC Managed Care – PPO | Admitting: Cardiology

## 2021-07-07 VITALS — BP 100/70 | HR 62 | Ht 66.0 in | Wt 110.0 lb

## 2021-07-07 DIAGNOSIS — Z8249 Family history of ischemic heart disease and other diseases of the circulatory system: Secondary | ICD-10-CM | POA: Insufficient documentation

## 2021-07-07 DIAGNOSIS — R002 Palpitations: Secondary | ICD-10-CM

## 2021-07-07 NOTE — Patient Instructions (Signed)
Medication Instructions:  The current medical regimen is effective;  continue present plan and medications.  *If you need a refill on your cardiac medications before your next appointment, please call your pharmacy*   Testing/Procedures: Your physician has requested that you have a Coronary Calcium score which is completed by CT. Cardiac computed tomography (CT) is a painless test that uses an x-ray machine to take clear, detailed pictures of your heart. There are no instructions for this testing.  You may eat/drink and take your normal medications this day.  The cost of the testing is $99 due at the time of your appointment.  Follow-Up: At [facility], you and your health needs are our priority.  As part of our continuing mission to provide you with exceptional heart care, we have created designated Provider Care Teams.  These Care Teams include your primary Cardiologist (physician) and Advanced Practice Providers (APPs -  Physician Assistants and Nurse Practitioners) who all work together to provide you with the care you need, when you need it.  We recommend signing up for the patient portal called "MyChart".  Sign up information is provided on this After Visit Summary.  MyChart is used to connect with patients for Virtual Visits (Telemedicine).  Patients are able to view lab/test results, encounter notes, upcoming appointments, etc.  Non-urgent messages can be sent to your provider as well.   To learn more about what you can do with MyChart, go to [email or web address].    Your next appointment:   1 year(s)  The format for your next appointment:   In Person  Provider:   Mark Skains, MD     Thank you for choosing [facility]!!    

## 2021-07-07 NOTE — Assessment & Plan Note (Signed)
Per mother had carotid endarterectomy with normal cholesterol levels.  We will go ahead and check a coronary calcium score.  If 0 continue with current prevention strategy.  May wish to check it again at approximately 41 years old.

## 2021-07-07 NOTE — Assessment & Plan Note (Signed)
No excessive scabbing noted.  Surrounding her mother's passing last year, she did feel some left arm tingling.  This worried her.  She is now feeling better.

## 2021-07-07 NOTE — Assessment & Plan Note (Signed)
Doing well with low-dose Toprol 25 mg once a day.  No changes made.  During exercise heart rates will get to the 180s to 190s but not to the 210 range.  She no longer feels the dizziness.  Continue with adequate hydration.

## 2021-07-14 ENCOUNTER — Other Ambulatory Visit: Payer: Self-pay | Admitting: *Deleted

## 2021-07-14 MED ORDER — METOPROLOL SUCCINATE ER 25 MG PO TB24: ORAL_TABLET | ORAL | 3 refills | Status: DC

## 2021-08-18 DIAGNOSIS — F4322 Adjustment disorder with anxiety: Secondary | ICD-10-CM | POA: Diagnosis not present

## 2021-08-18 DIAGNOSIS — F411 Generalized anxiety disorder: Secondary | ICD-10-CM | POA: Diagnosis not present

## 2021-09-01 DIAGNOSIS — F411 Generalized anxiety disorder: Secondary | ICD-10-CM | POA: Diagnosis not present

## 2021-09-01 DIAGNOSIS — F4322 Adjustment disorder with anxiety: Secondary | ICD-10-CM | POA: Diagnosis not present

## 2021-09-18 DIAGNOSIS — F411 Generalized anxiety disorder: Secondary | ICD-10-CM | POA: Diagnosis not present

## 2021-09-18 DIAGNOSIS — F4322 Adjustment disorder with anxiety: Secondary | ICD-10-CM | POA: Diagnosis not present

## 2021-10-02 DIAGNOSIS — F411 Generalized anxiety disorder: Secondary | ICD-10-CM | POA: Diagnosis not present

## 2021-10-02 DIAGNOSIS — F4322 Adjustment disorder with anxiety: Secondary | ICD-10-CM | POA: Diagnosis not present

## 2021-10-05 DIAGNOSIS — F411 Generalized anxiety disorder: Secondary | ICD-10-CM | POA: Diagnosis not present

## 2021-10-05 DIAGNOSIS — F4322 Adjustment disorder with anxiety: Secondary | ICD-10-CM | POA: Diagnosis not present

## 2021-11-03 DIAGNOSIS — F4322 Adjustment disorder with anxiety: Secondary | ICD-10-CM | POA: Diagnosis not present

## 2021-11-03 DIAGNOSIS — F411 Generalized anxiety disorder: Secondary | ICD-10-CM | POA: Diagnosis not present

## 2021-11-18 DIAGNOSIS — E28319 Asymptomatic premature menopause: Secondary | ICD-10-CM | POA: Diagnosis not present

## 2021-12-08 DIAGNOSIS — Z30430 Encounter for insertion of intrauterine contraceptive device: Secondary | ICD-10-CM | POA: Diagnosis not present

## 2021-12-30 DIAGNOSIS — Z30431 Encounter for routine checking of intrauterine contraceptive device: Secondary | ICD-10-CM | POA: Diagnosis not present

## 2022-01-06 DIAGNOSIS — N951 Menopausal and female climacteric states: Secondary | ICD-10-CM | POA: Diagnosis not present

## 2022-01-15 DIAGNOSIS — F411 Generalized anxiety disorder: Secondary | ICD-10-CM | POA: Diagnosis not present

## 2022-01-15 DIAGNOSIS — F4322 Adjustment disorder with anxiety: Secondary | ICD-10-CM | POA: Diagnosis not present

## 2022-03-03 DIAGNOSIS — F4322 Adjustment disorder with anxiety: Secondary | ICD-10-CM | POA: Diagnosis not present

## 2022-03-03 DIAGNOSIS — F411 Generalized anxiety disorder: Secondary | ICD-10-CM | POA: Diagnosis not present

## 2022-04-12 DIAGNOSIS — F411 Generalized anxiety disorder: Secondary | ICD-10-CM | POA: Diagnosis not present

## 2022-04-12 DIAGNOSIS — F4322 Adjustment disorder with anxiety: Secondary | ICD-10-CM | POA: Diagnosis not present

## 2022-04-17 DIAGNOSIS — K219 Gastro-esophageal reflux disease without esophagitis: Secondary | ICD-10-CM | POA: Diagnosis not present

## 2022-04-17 DIAGNOSIS — F411 Generalized anxiety disorder: Secondary | ICD-10-CM | POA: Diagnosis not present

## 2022-04-27 DIAGNOSIS — R748 Abnormal levels of other serum enzymes: Secondary | ICD-10-CM | POA: Diagnosis not present

## 2022-04-29 DIAGNOSIS — D1803 Hemangioma of intra-abdominal structures: Secondary | ICD-10-CM | POA: Diagnosis not present

## 2022-04-30 DIAGNOSIS — N951 Menopausal and female climacteric states: Secondary | ICD-10-CM | POA: Diagnosis not present

## 2022-05-12 DIAGNOSIS — R748 Abnormal levels of other serum enzymes: Secondary | ICD-10-CM | POA: Diagnosis not present

## 2022-06-01 DIAGNOSIS — K625 Hemorrhage of anus and rectum: Secondary | ICD-10-CM | POA: Diagnosis not present

## 2022-06-01 DIAGNOSIS — R1030 Lower abdominal pain, unspecified: Secondary | ICD-10-CM | POA: Diagnosis not present

## 2022-06-01 DIAGNOSIS — R634 Abnormal weight loss: Secondary | ICD-10-CM | POA: Diagnosis not present

## 2022-06-01 DIAGNOSIS — R748 Abnormal levels of other serum enzymes: Secondary | ICD-10-CM | POA: Diagnosis not present

## 2022-06-02 DIAGNOSIS — R634 Abnormal weight loss: Secondary | ICD-10-CM | POA: Diagnosis not present

## 2022-06-11 DIAGNOSIS — R630 Anorexia: Secondary | ICD-10-CM | POA: Diagnosis not present

## 2022-06-11 DIAGNOSIS — R634 Abnormal weight loss: Secondary | ICD-10-CM | POA: Diagnosis not present

## 2022-06-11 DIAGNOSIS — K5900 Constipation, unspecified: Secondary | ICD-10-CM | POA: Diagnosis not present

## 2022-06-11 DIAGNOSIS — K625 Hemorrhage of anus and rectum: Secondary | ICD-10-CM | POA: Diagnosis not present

## 2022-06-11 DIAGNOSIS — R109 Unspecified abdominal pain: Secondary | ICD-10-CM | POA: Diagnosis not present

## 2022-06-21 ENCOUNTER — Other Ambulatory Visit: Payer: Self-pay | Admitting: Cardiology

## 2023-04-28 IMAGING — CT CT CARDIAC CORONARY ARTERY CALCIUM SCORE
3 series · 14 of 20 positions shown, 16 images · non-contrast
Comparison: None.
COMPARISON: None.

Addendum:
EXAM:
OVER-READ INTERPRETATION  CT CHEST

The following report is an over-read performed by radiologist Dr.
Manrrique Bolom [REDACTED] on 07/07/2021. This
over-read does not include interpretation of cardiac or coronary
anatomy or pathology. The coronary calcium score interpretation by
the cardiologist is attached.
CLINICAL DATA: Cardiovascular Disease Risk stratification
Coronary Calcium Score
TECHNIQUE: A gated, non-contrast computed tomography scan of the heart was
performed using 3mm slice thickness. Axial images were analyzed on a
dedicated workstation. Calcium scoring of the coronary arteries was
performed using the Agatston method.

[Series 2: cascseq 2.0 sa36 70% (id) · axial · 0.39mm/px · z∈[-209,-107]mm · 4 of 85 slices shown]
[im 17/85  vessel]
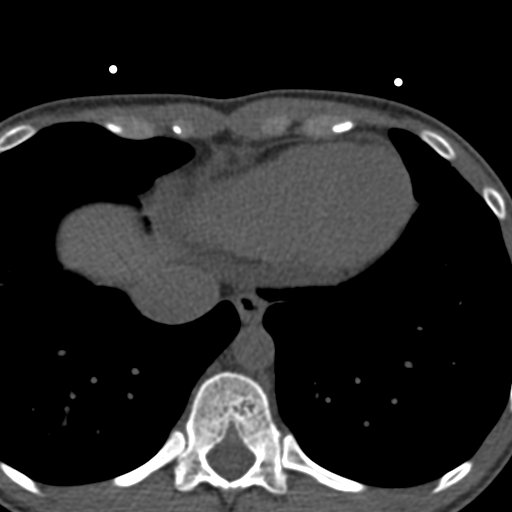
[im 34/85  vessel]
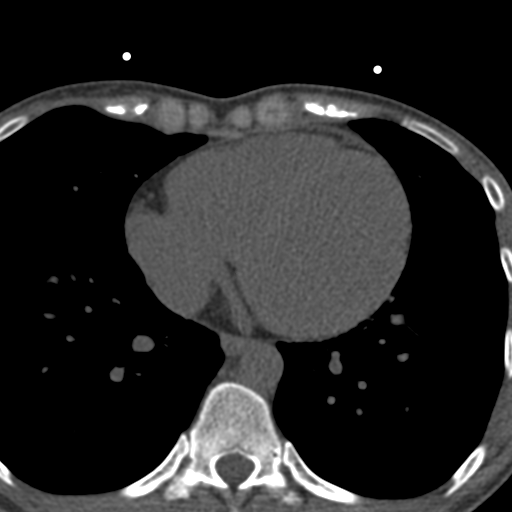
[im 51/85  vessel]
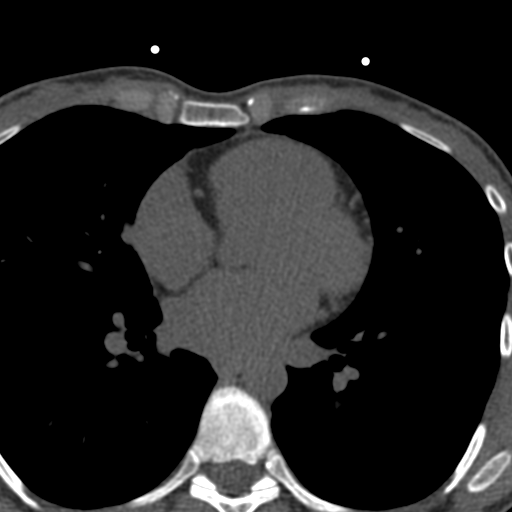
[im 68/85  vessel]
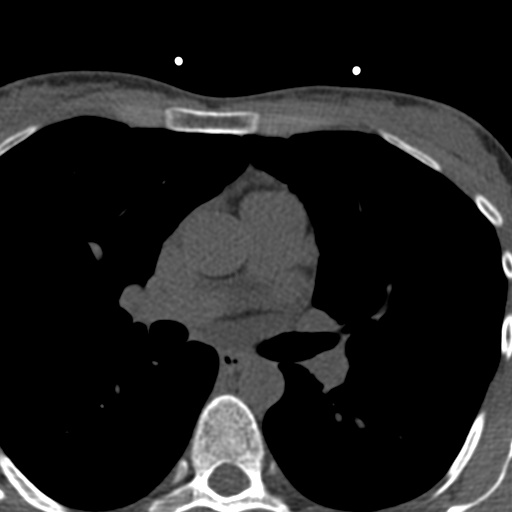

[Series 3: cascseq 2.0 bf37 st · axial · 0.61mm/px · z∈[-213,-101]mm · 5 of 85 slices shown, 7 images]
[im 15/85  vessel]
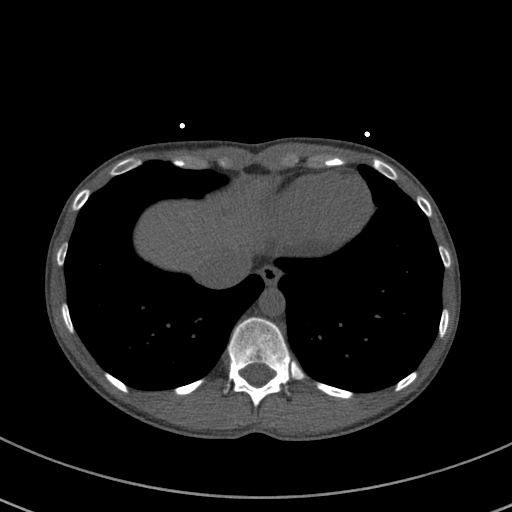
[im 15/85  lung]
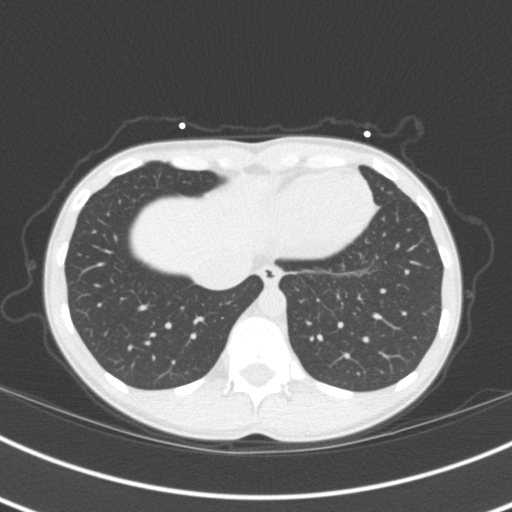
[im 29/85  vessel]
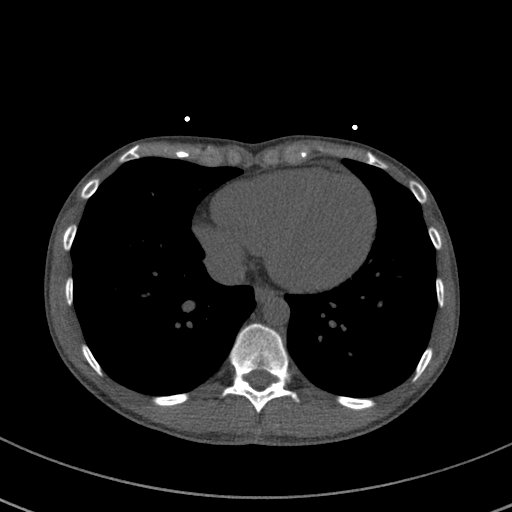
[im 43/85  vessel]
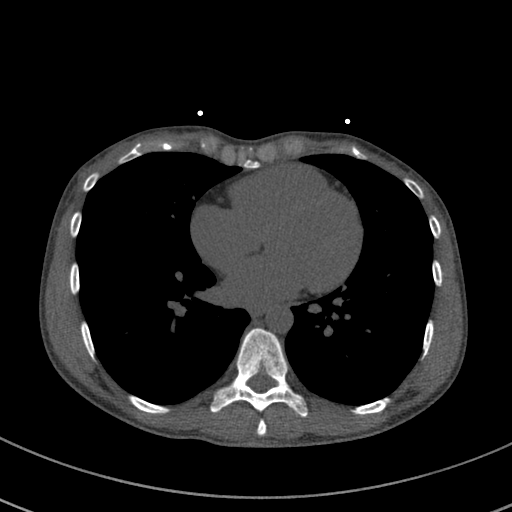
[im 57/85  vessel]
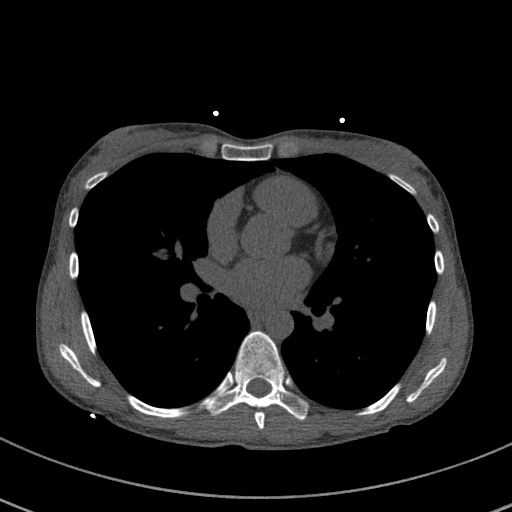
[im 71/85  vessel]
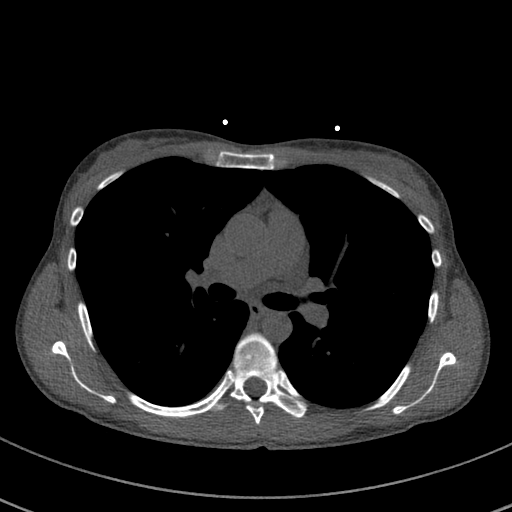
[im 71/85  lung]
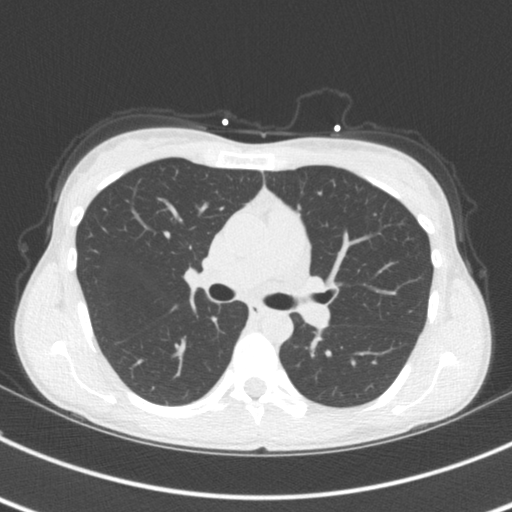

[Series 4: cascseq 2.0 br59 lung · axial · 0.61mm/px · z∈[-213,-101]mm · 5 of 85 slices shown]
[im 15/85  lung]
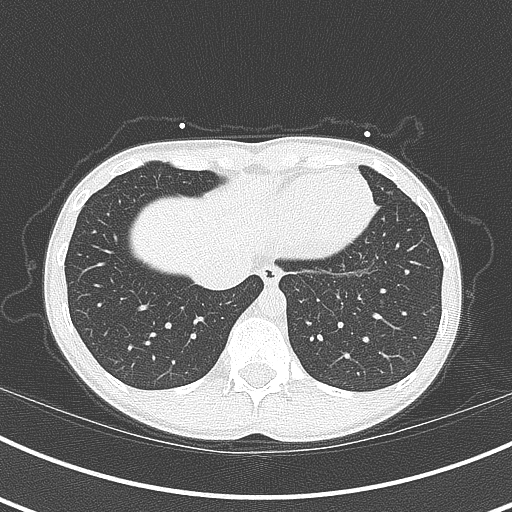
[im 29/85  lung]
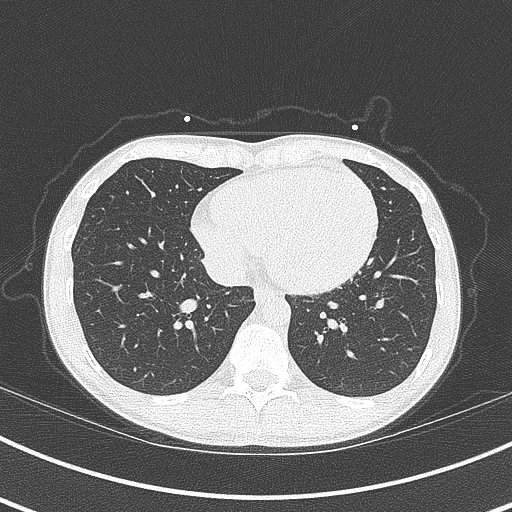
[im 43/85  lung]
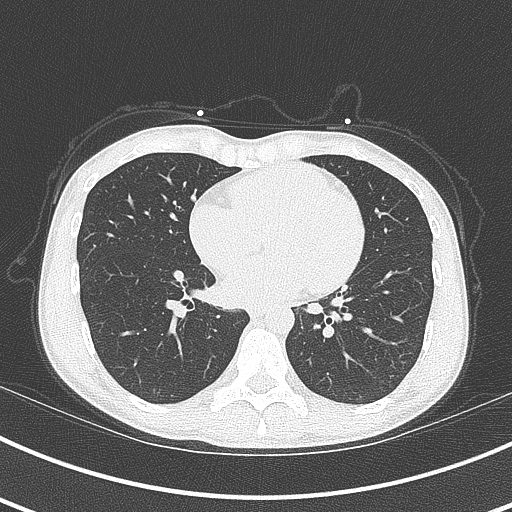
[im 57/85  lung]
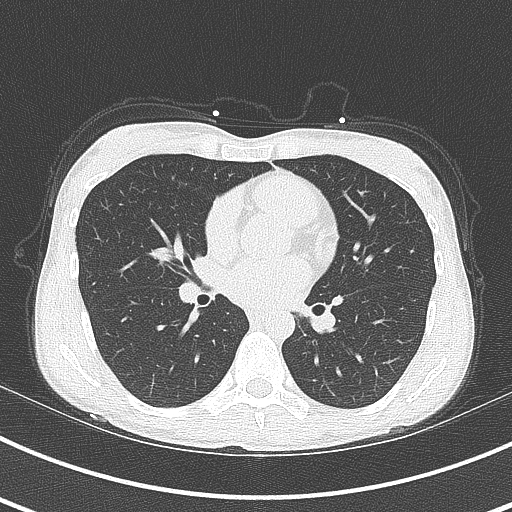
[im 71/85  lung]
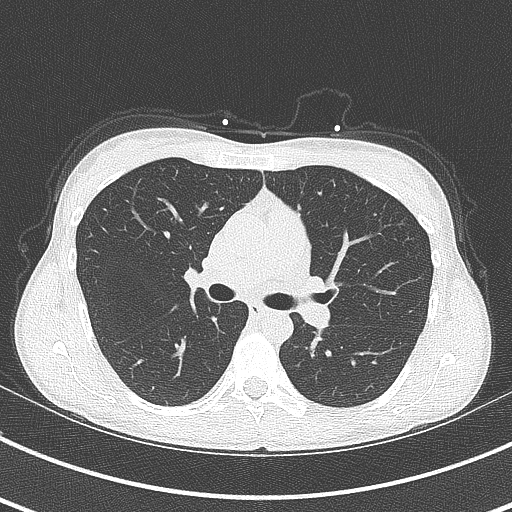

[14 of 20 positions shown; findings below may reference images not displayed]

FINDINGS: Within the visualized portions of the thorax there are no suspicious
appearing pulmonary nodules or masses, there is no acute
consolidative airspace disease, no pleural effusions, no
pneumothorax and no lymphadenopathy. Visualized portions of the
upper abdomen are unremarkable. There are no aggressive appearing
lytic or blastic lesions noted in the visualized portions of the
skeleton.
IMPRESSION: 1. No significant incidental noncardiac findings are noted.
FINDINGS: Coronary Calcium Score:

Left main: 0

Left anterior descending artery: 0

Left circumflex artery: 0

Right coronary artery: 0

Total: 0

Percentile: 0

Pericardium: Normal.

Ascending Aorta: Normal caliber.

Non-cardiac: See separate report from [REDACTED].
IMPRESSION: Coronary calcium score of 0. This was 0 percentile for age-, race-,
and sex-matched controls.



If CAC=0, it is reasonable to withhold statin therapy and reassess
in 5 to 10 years, as long as higher risk conditions are absent
(diabetes mellitus, family history of premature CHD in first degree
relatives (males <55 years; females <65 years), cigarette smoking,
or LDL >=190 mg/dL).

If CAC is 1 to 99, it is reasonable to initiate statin therapy for
patients >=55 years of age.

If CAC is >=100 or >=75th percentile, it is reasonable to initiate
statin therapy at any age.

Cardiology referral should be considered for patients with CAC
scores >=400 or >=75th percentile.

*4167 AHA/ACC/AACVPR/AAPA/ABC/BISSELL/RENE EDGAR/CHAI/Behan/CHAIRES/WILHITE/SUI
Guideline on the Management of Blood Cholesterol: A Report of the
American College of Cardiology/American Heart Association Task Force
on Clinical Practice Guidelines. J Am Coll Cardiol.
8839;73(24):5753-5733.

*** End of Addendum ***
EXAM:
OVER-READ INTERPRETATION  CT CHEST

The following report is an over-read performed by radiologist Dr.
Manrrique Bolom [REDACTED] on 07/07/2021. This
over-read does not include interpretation of cardiac or coronary
anatomy or pathology. The coronary calcium score interpretation by
the cardiologist is attached.
FINDINGS: Within the visualized portions of the thorax there are no suspicious
appearing pulmonary nodules or masses, there is no acute
consolidative airspace disease, no pleural effusions, no
pneumothorax and no lymphadenopathy. Visualized portions of the
upper abdomen are unremarkable. There are no aggressive appearing
lytic or blastic lesions noted in the visualized portions of the
skeleton.
IMPRESSION: 1. No significant incidental noncardiac findings are noted.

## 2023-06-16 ENCOUNTER — Other Ambulatory Visit: Payer: Self-pay | Admitting: Cardiology
